# Patient Record
Sex: Male | Born: 1975
Health system: Southern US, Community
[De-identification: ages and names within clinical notes are randomized; demographics above are authoritative.]

## PROBLEM LIST (undated history)

## (undated) DIAGNOSIS — I10 Essential (primary) hypertension: Secondary | ICD-10-CM

## (undated) DIAGNOSIS — M109 Gout, unspecified: Secondary | ICD-10-CM

## (undated) HISTORY — PX: EYE SURGERY: SHX253

## (undated) HISTORY — PX: MENISCUS REPAIR: SHX5179

---

## 2012-01-11 ENCOUNTER — Ambulatory Visit: Payer: Self-pay

## 2013-04-28 ENCOUNTER — Ambulatory Visit: Payer: Self-pay | Admitting: Family Medicine

## 2013-06-18 IMAGING — CR DG CHEST 2V
1 series · 3 of 3 positions shown · non-contrast
Comparison: none

REASON FOR EXAM: cough nonprod. and sinus and chest congestion
COMMENTS:

PROCEDURE:     MDR - MDR CHEST PA(OR AP) AND LATERAL  - April 28, 2013 [DATE]
RESULT:     The lungs are adequately inflated and clear. The cardiac
silhouette is normal in size. The mediastinum is normal in width. There is
no pleural effusion. The bony thorax is normal where visualized.

[Series 1: pa · 0.17mm/px · 3 of 3 slices shown]
[im 1/3]
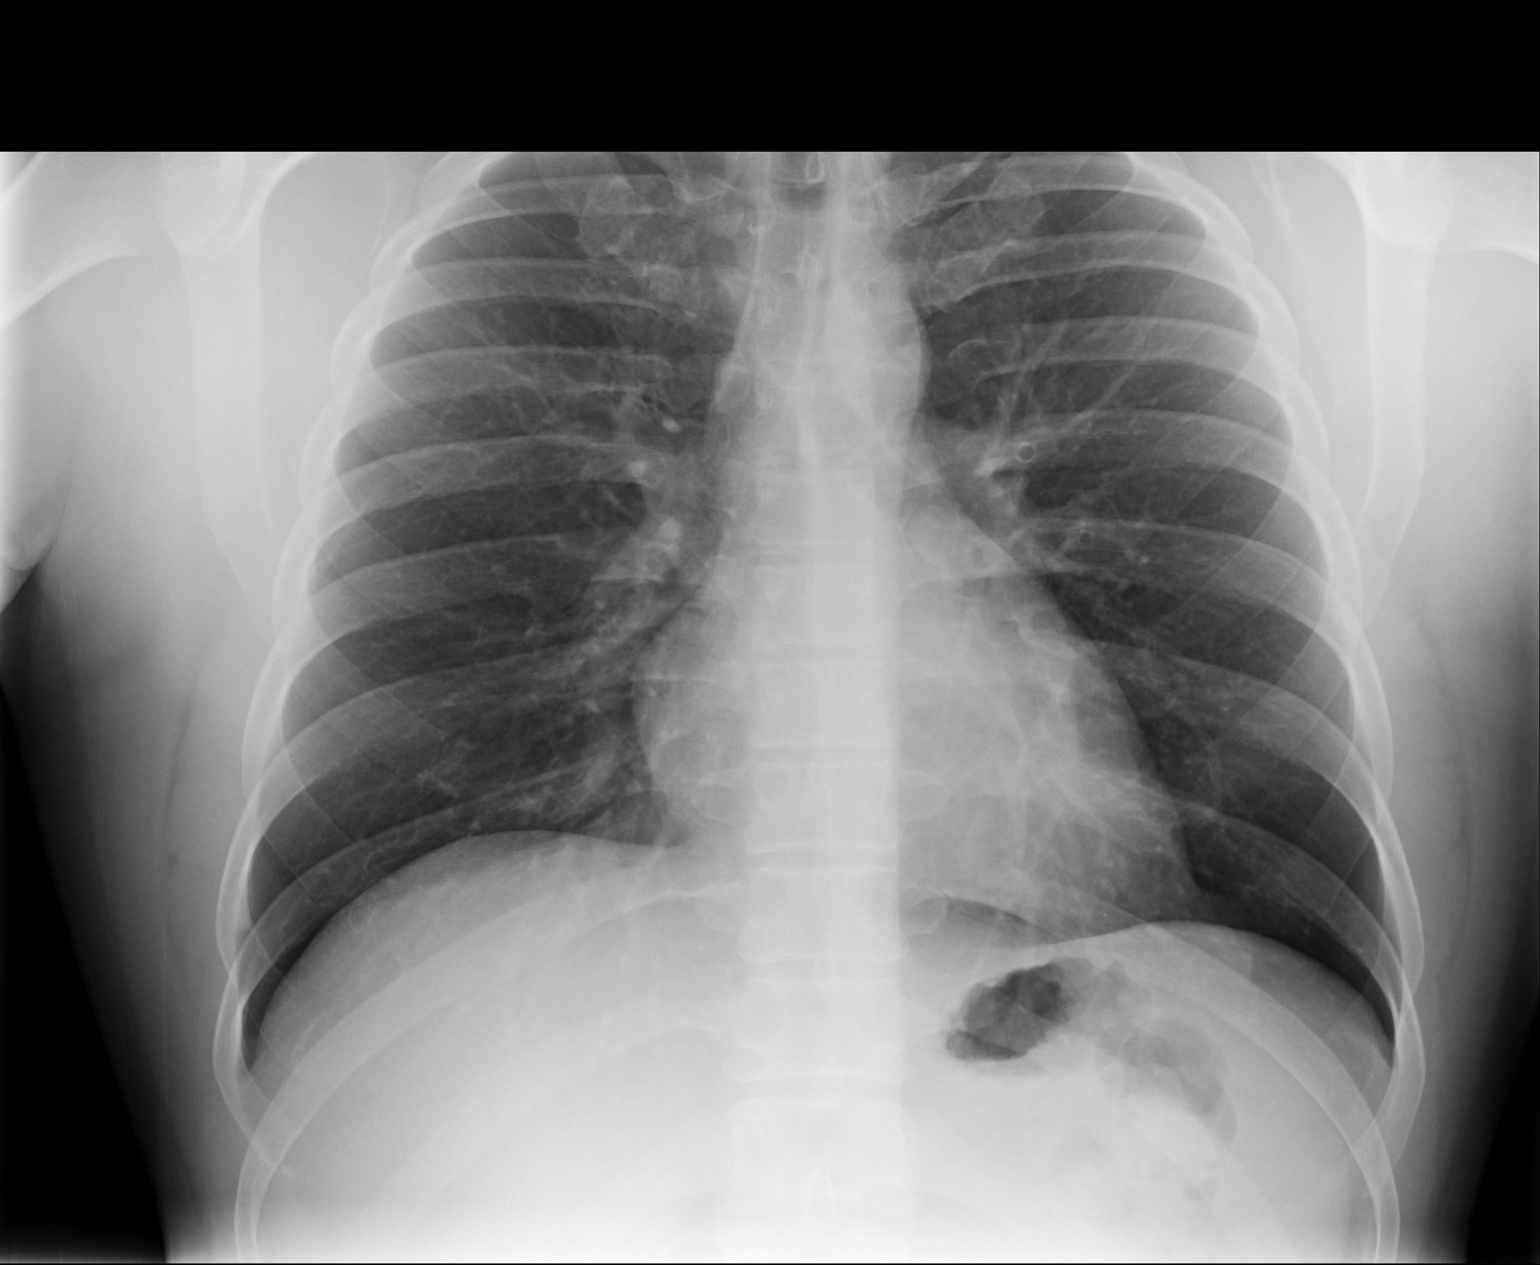
[im 2/3]
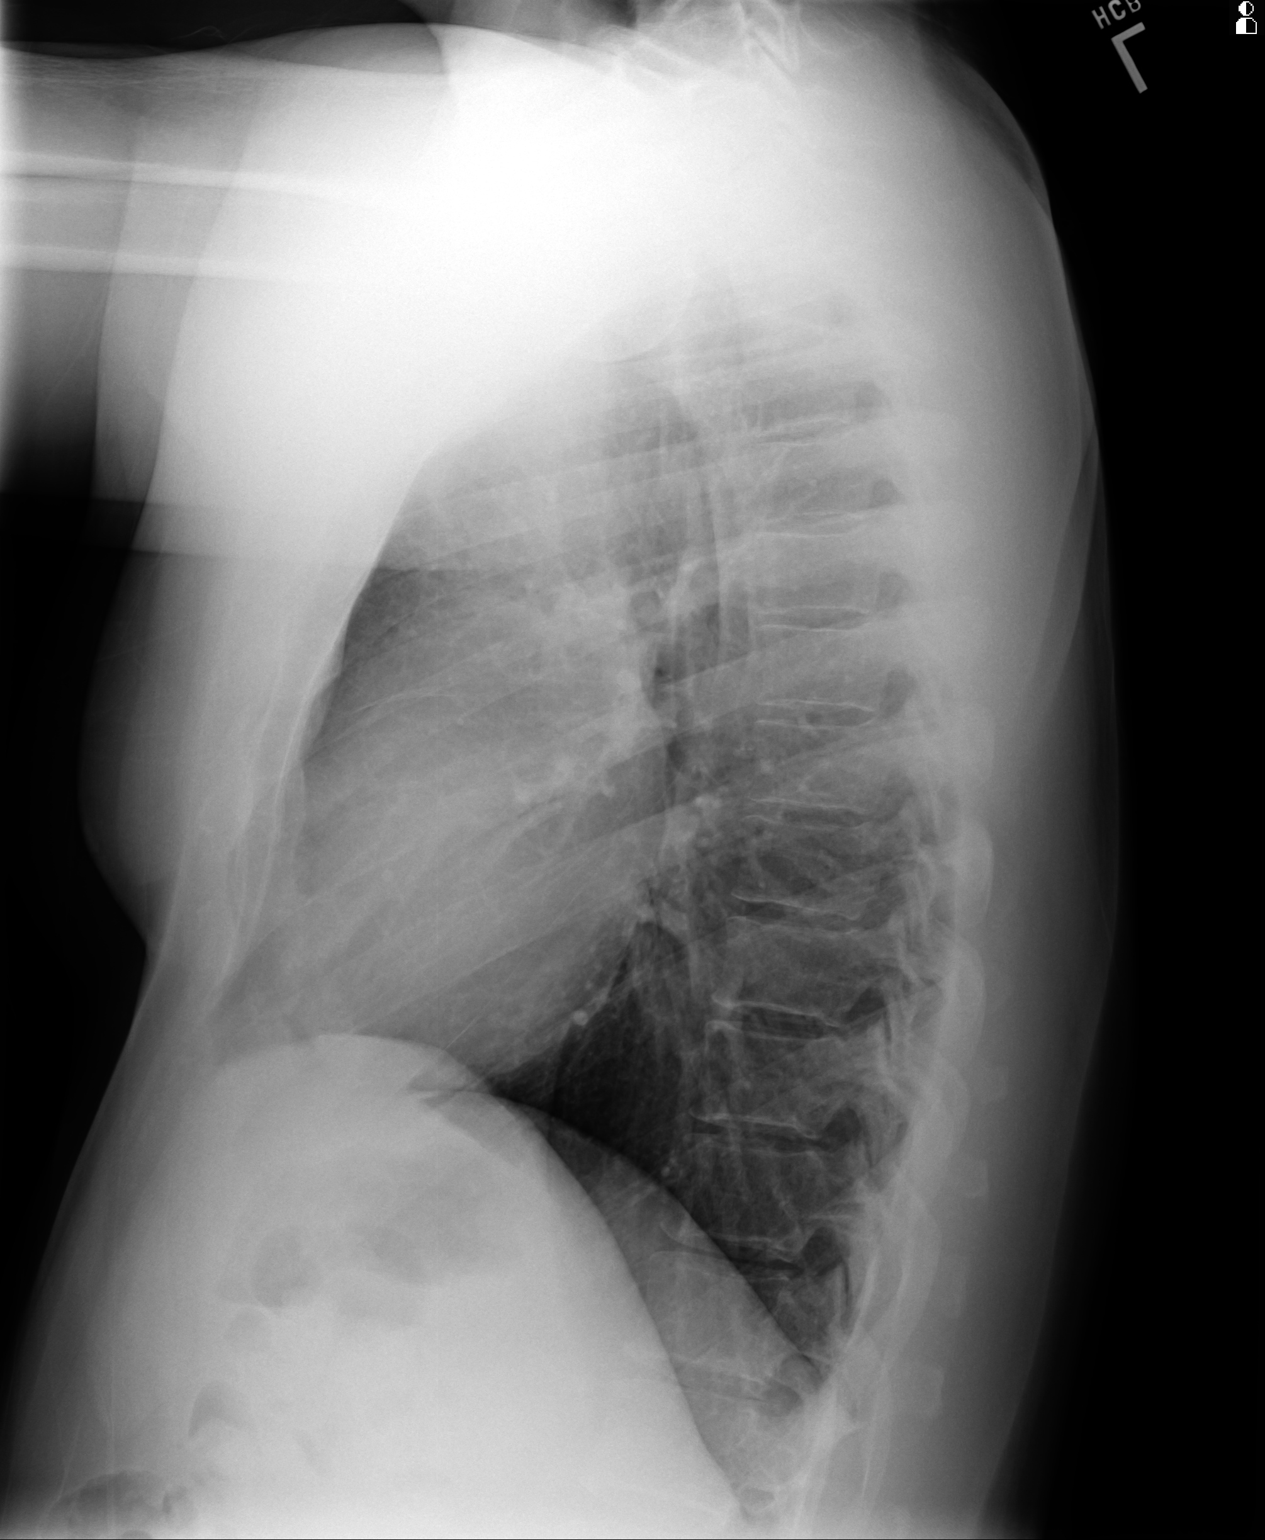
[im 3/3]
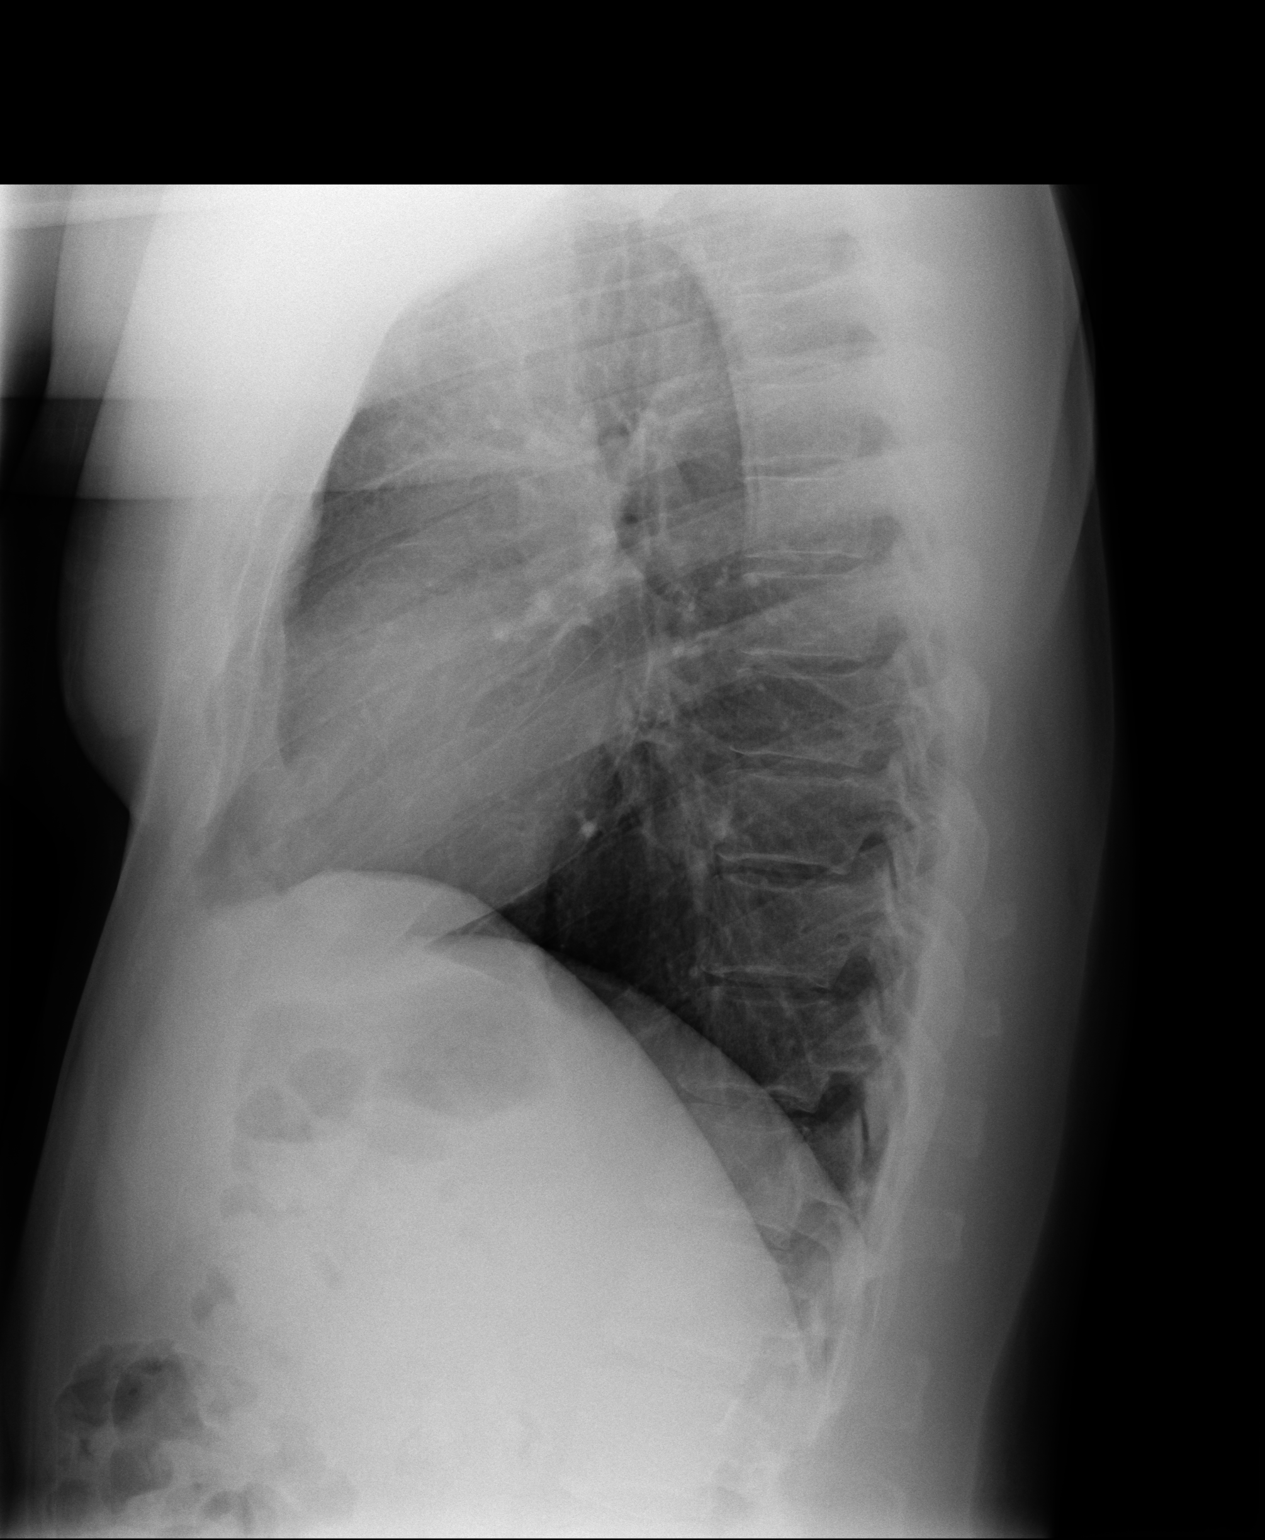

[3 of 3 positions shown; findings below may reference images not displayed]

IMPRESSION: There is no evidence of acute cardiopulmonary abnormality.

[REDACTED]

## 2014-10-28 ENCOUNTER — Ambulatory Visit: Payer: Self-pay | Admitting: Physician Assistant

## 2015-05-26 ENCOUNTER — Other Ambulatory Visit: Payer: Self-pay | Admitting: Family Medicine

## 2015-05-27 ENCOUNTER — Other Ambulatory Visit: Payer: Self-pay | Admitting: Family Medicine

## 2015-08-19 ENCOUNTER — Other Ambulatory Visit: Payer: Self-pay | Admitting: Family Medicine

## 2015-08-19 DIAGNOSIS — E790 Hyperuricemia without signs of inflammatory arthritis and tophaceous disease: Secondary | ICD-10-CM

## 2015-08-19 MED ORDER — ALLOPURINOL 300 MG PO TABS: 300.0000 mg | ORAL_TABLET | Freq: Every day | ORAL | Status: DC

## 2016-03-22 ENCOUNTER — Other Ambulatory Visit: Payer: Self-pay | Admitting: Family Medicine

## 2016-06-17 ENCOUNTER — Other Ambulatory Visit: Payer: Self-pay | Admitting: Family Medicine

## 2016-06-19 NOTE — Telephone Encounter (Signed)
Please review chart and advise. KW 

## 2016-09-13 ENCOUNTER — Other Ambulatory Visit: Payer: Self-pay | Admitting: Family Medicine

## 2016-12-10 ENCOUNTER — Other Ambulatory Visit: Payer: Self-pay | Admitting: Family Medicine

## 2017-03-08 ENCOUNTER — Other Ambulatory Visit: Payer: Self-pay | Admitting: Family Medicine

## 2017-03-09 ENCOUNTER — Encounter: Payer: Self-pay | Admitting: Family Medicine

## 2017-03-09 ENCOUNTER — Ambulatory Visit (INDEPENDENT_AMBULATORY_CARE_PROVIDER_SITE_OTHER): Payer: BLUE CROSS/BLUE SHIELD | Admitting: Family Medicine

## 2017-03-09 VITALS — BP 110/74 | HR 56 | Temp 98.0°F | Resp 16 | Wt 213.0 lb

## 2017-03-09 DIAGNOSIS — E785 Hyperlipidemia, unspecified: Secondary | ICD-10-CM | POA: Insufficient documentation

## 2017-03-09 DIAGNOSIS — Z8739 Personal history of other diseases of the musculoskeletal system and connective tissue: Secondary | ICD-10-CM | POA: Diagnosis not present

## 2017-03-09 DIAGNOSIS — E782 Mixed hyperlipidemia: Secondary | ICD-10-CM

## 2017-03-09 NOTE — Progress Notes (Signed)
Subjective:     Patient ID: Jordan Gonzales, male   DOB: [birth year], 41 y.o.   MRN: [id number]  HPI  Chief Complaint  Patient presents with  . Gout    Patient comes in office today for follow up visit, he states that he recently had Allopurinol refilled and reports good compliance on medication. Patient denies any recent gout flare ups.   Last seen in 2015. Prior attacks have involved his right toe, foot, and ankle. Has family history of gout in his father and uncle.   Review of Systems     Objective:   Physical Exam  Constitutional: He appears well-developed and well-nourished. No distress.       Assessment:    1. History of gout - Renal function panel - Uric acid  2. Mixed hyperlipidemia - Lipid panel    Plan:    Further f/u pending lab results. Continue allopurinol.

## 2017-03-09 NOTE — Patient Instructions (Signed)
We will call you with the lab results. 

## 2017-03-10 LAB — RENAL FUNCTION PANEL
Albumin: 4.9 g/dL (ref 3.5–5.5)
BUN/Creatinine Ratio: 14 (ref 9–20)
BUN: 14 mg/dL (ref 6–24)
CO2: 27 mmol/L (ref 18–29)
Calcium: 9.8 mg/dL (ref 8.7–10.2)
Chloride: 99 mmol/L (ref 96–106)
Creatinine, Ser: 0.98 mg/dL (ref 0.76–1.27)
GFR calc Af Amer: 109 mL/min/{1.73_m2} (ref >59)
GFR calc non Af Amer: 95 mL/min/{1.73_m2} (ref >59)
Glucose: 107 mg/dL — ABNORMAL HIGH (ref 65–99)
Phosphorus: 3.4 mg/dL (ref 2.5–4.5)
Potassium: 4.7 mmol/L (ref 3.5–5.2)
Sodium: 140 mmol/L (ref 134–144)

## 2017-03-10 LAB — LIPID PANEL
Chol/HDL Ratio: 4.6 ratio (ref 0.0–5.0)
Cholesterol, Total: 211 mg/dL — ABNORMAL HIGH (ref 100–199)
HDL: 46 mg/dL (ref >39)
LDL Calculated: 138 mg/dL — ABNORMAL HIGH (ref 0–99)
Triglycerides: 135 mg/dL (ref 0–149)
VLDL Cholesterol Cal: 27 mg/dL (ref 5–40)

## 2017-03-10 LAB — URIC ACID: Uric Acid: 4.6 mg/dL (ref 3.7–8.6)

## 2017-03-12 NOTE — Progress Notes (Signed)
Advised  ED 

## 2017-06-02 ENCOUNTER — Other Ambulatory Visit: Payer: Self-pay | Admitting: Family Medicine

## 2017-09-14 ENCOUNTER — Ambulatory Visit (INDEPENDENT_AMBULATORY_CARE_PROVIDER_SITE_OTHER): Payer: BLUE CROSS/BLUE SHIELD | Admitting: Family Medicine

## 2017-09-14 ENCOUNTER — Encounter: Payer: Self-pay | Admitting: Family Medicine

## 2017-09-14 VITALS — BP 122/78 | HR 72 | Temp 98.5°F | Resp 14 | Wt 209.6 lb

## 2017-09-14 DIAGNOSIS — Z23 Encounter for immunization: Secondary | ICD-10-CM | POA: Diagnosis not present

## 2017-09-14 DIAGNOSIS — Z029 Encounter for administrative examinations, unspecified: Secondary | ICD-10-CM

## 2017-09-14 NOTE — Patient Instructions (Signed)
Continue following a low fat food choice diet.

## 2017-09-14 NOTE — Progress Notes (Signed)
Subjective:     Patient ID: Ina HomesChristopher Murray Borrero, male   DOB: Jun 05, 1976, 41 y.o.   MRN: [id number] HPI  Chief Complaint  Patient presents with  . Follow-up    Patient is here to get forms signed and filled out for him to be able to go through [employer] test for [employer].  He is currently on the [employer]and wishes to get on the [employer] [facility] [facility] [employer] [facility] [employer] [facility]. He is required to do agility tests and needs approval for participation. He states the tests involve activity that he does routinely. 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 Calculated ten year c.v.risk is low @ < 2%.   Review of Systems  Respiratory: Negative for shortness of breath.   Cardiovascular: Negative for chest pain and palpitations.  Endocrine:       Was noted to have 0.5 mm thyroid cysts on a [employer]  Genitourinary:       Was noted to have a fatty liver on a [employer] exam per his job.       Objective:   Physical Exam  Constitutional: He appears well-developed and well-nourished. No distress.  Cardiovascular: Normal rate and regular rhythm.   Pulmonary/Chest: Breath sounds normal.       Assessment:    1. Encounter for administrative examinations: Forms completed for participation in agility tests  2. Need for influenza vaccination - Flu Vaccine QUAD 36+ mos IM    Plan:    Continue low fat food choices.

## 2017-12-03 ENCOUNTER — Encounter: Payer: Self-pay | Admitting: Family Medicine

## 2017-12-03 ENCOUNTER — Ambulatory Visit: Payer: BLUE CROSS/BLUE SHIELD | Admitting: Family Medicine

## 2017-12-03 VITALS — BP 164/110 | HR 67 | Temp 98.6°F | Resp 16 | Wt 212.2 lb

## 2017-12-03 DIAGNOSIS — I10 Essential (primary) hypertension: Secondary | ICD-10-CM

## 2017-12-03 MED ORDER — AMLODIPINE BESYLATE 5 MG PO TABS: 5.0000 mg | ORAL_TABLET | Freq: Every day | ORAL | 0 refills | Status: DC

## 2017-12-03 NOTE — Progress Notes (Signed)
Subjective:     Patient ID: Jordan Gonzales, male   DOB: [birth year], 42 y.o.   MRN: [id number] Chief Complaint  Patient presents with  . Hypertension    Patient comes in office today with concerns of elevated blood pressure since 09/19/17, patient states that he started logging his readings in December and systolic has stayed in the 160s and diastolic has read between 90-110.    HPI Reports he will be starting with the [employer] 2/16 and will need to have his bp under control (at least SBP < 160) to work. States he exercises daily with 2 miles on the treadmill and weights. Has reduced consumption of salt and not on any nsaid's, weight loss medication, or cold medication. There is a paternal history of hypertension.  Review of Systems     Objective:   Physical Exam  Constitutional: He appears well-developed and well-nourished.  Cardiovascular: Normal rate and regular rhythm.  Pulmonary/Chest: Breath sounds normal.  Musculoskeletal: He exhibits no edema (of distal lower extremities).       Assessment:    1. Essential hypertension - Renal function panel - amLODipine (NORVASC) 5 MG tablet; Take 1 tablet (5 mg total) by mouth daily.  Dispense: 30 tablet; Refill: 0    Plan:    Further f/u pending lab work. Nurse bp check in two weeks.

## 2017-12-03 NOTE — Patient Instructions (Signed)
Nurse bp check in two weeks. We will call you with the lab results.

## 2017-12-04 ENCOUNTER — Telehealth: Payer: Self-pay

## 2017-12-04 LAB — RENAL FUNCTION PANEL
Albumin: 4.9 g/dL (ref 3.5–5.5)
BUN/Creatinine Ratio: 11 (ref 9–20)
BUN: 12 mg/dL (ref 6–24)
CO2: 25 mmol/L (ref 20–29)
Calcium: 9.8 mg/dL (ref 8.7–10.2)
Chloride: 101 mmol/L (ref 96–106)
Creatinine, Ser: 1.05 mg/dL (ref 0.76–1.27)
GFR calc Af Amer: 100 mL/min/{1.73_m2} (ref >59)
GFR calc non Af Amer: 87 mL/min/{1.73_m2} (ref >59)
Glucose: 125 mg/dL — ABNORMAL HIGH (ref 65–99)
Phosphorus: 2.8 mg/dL (ref 2.5–4.5)
Potassium: 5.1 mmol/L (ref 3.5–5.2)
Sodium: 141 mmol/L (ref 134–144)

## 2017-12-04 NOTE — Telephone Encounter (Signed)
-----   Message from Anola Gurneyobert Chauvin, [place] sent at 12/04/2017  7:23 AM EST ----- Renal function good.

## 2017-12-04 NOTE — Telephone Encounter (Signed)
Patient advised.KW 

## 2017-12-13 ENCOUNTER — Other Ambulatory Visit: Payer: Self-pay | Admitting: Family Medicine

## 2017-12-13 ENCOUNTER — Telehealth: Payer: Self-pay

## 2017-12-13 MED ORDER — AMLODIPINE BESYLATE 10 MG PO TABS: 10.0000 mg | ORAL_TABLET | Freq: Every day | ORAL | 0 refills | Status: DC

## 2017-12-13 NOTE — Telephone Encounter (Signed)
Amlodipine increased to 10 mg. Daily. 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

## 2017-12-13 NOTE — Telephone Encounter (Signed)
Patient walked into office today for nurse blood pressure checked he states that he feels fine and has no concerns or complaints. Patient reports good compliance on Amlodipine and denies any side effects. Patient blood pressure in house today was 150/54.KW

## 2017-12-29 ENCOUNTER — Other Ambulatory Visit: Payer: Self-pay | Admitting: Family Medicine

## 2017-12-29 DIAGNOSIS — I10 Essential (primary) hypertension: Secondary | ICD-10-CM

## 2018-03-10 ENCOUNTER — Other Ambulatory Visit: Payer: Self-pay | Admitting: Family Medicine

## 2018-05-22 ENCOUNTER — Other Ambulatory Visit: Payer: Self-pay | Admitting: Family Medicine

## 2018-06-20 ENCOUNTER — Encounter: Payer: Self-pay | Admitting: Family Medicine

## 2018-06-20 ENCOUNTER — Ambulatory Visit: Payer: BLUE CROSS/BLUE SHIELD | Admitting: Family Medicine

## 2018-06-20 VITALS — BP 132/84 | HR 66 | Temp 97.8°F | Resp 15 | Wt 207.2 lb

## 2018-06-20 DIAGNOSIS — Z87898 Personal history of other specified conditions: Secondary | ICD-10-CM

## 2018-06-20 NOTE — Patient Instructions (Signed)
Return to work full duty. Continue to keep up with fluids during this hot weather.

## 2018-06-20 NOTE — Progress Notes (Signed)
  Subjective:     Patient ID: Jordan Gonzales, male   DOB: July 11, 1976, 42 y.o.   MRN: [id number] Chief Complaint  Patient presents with  . Letter for School/Work    Patient comes in office today requesting clearance to return back to work. Patient states yesterday while at work he had took a vitamin pack and shortly after taking it within a matter of min he felt nauseas, blood pressure was checked and was high and was advised to seek medical treatment. Patient was tranported to hospital by EMS and was cleared with normal labs, he was instructed to drink plenty of fluids. Patient states that he feels fine today and knows to take vitamin with food.    HPI States he feels well and wishes to return to full duty as a [employer]. He may discontinue the vitamins he was previously taking. No change in bowel or bladder habits or blood in his stool.  Review of Systems     Objective:   Physical Exam  Constitutional: He appears well-developed and well-nourished. No distress.  Cardiovascular: Normal rate and regular rhythm.  Pulmonary/Chest: Breath sounds normal.  Abdominal: Soft. There is no tenderness.  Musculoskeletal: He exhibits no edema (of lower extremities).       Assessment:    1. Hx of nausea: resolved    Plan    May return to full duty.

## 2019-03-03 ENCOUNTER — Telehealth: Payer: Self-pay | Admitting: Physician Assistant

## 2019-03-03 DIAGNOSIS — I10 Essential (primary) hypertension: Secondary | ICD-10-CM

## 2019-03-03 MED ORDER — AMLODIPINE BESYLATE 10 MG PO TABS: 10.0000 mg | ORAL_TABLET | Freq: Every day | ORAL | 0 refills | Status: DC

## 2019-03-03 NOTE — Telephone Encounter (Signed)
Sent in 3 months. Patient needs to establish with new provider in that time frame.

## 2019-03-03 NOTE — Telephone Encounter (Signed)
[facility] faxed refill request for the following medications:  amLODipine (NORVASC) 10 MG tablet  90 day supply  Last Rx: 03/11/2018 LOV: 06/20/2018 with Nadine Counts. Pt isn't scheduled for a follow-up OV. Please advise. Thanks TNP

## 2019-03-03 NOTE — Telephone Encounter (Signed)
Please Review

## 2019-05-16 ENCOUNTER — Other Ambulatory Visit: Payer: Self-pay | Admitting: Family Medicine

## 2019-05-16 MED ORDER — ALLOPURINOL 300 MG PO TABS: 300.0000 mg | ORAL_TABLET | Freq: Every day | ORAL | 3 refills | Status: DC

## 2019-05-16 NOTE — Telephone Encounter (Signed)
[facility] faxed refill request for the following medications:  allopurinol (ZYLOPRIM) 300 MG tablet   Please advise.  

## 2019-05-19 ENCOUNTER — Other Ambulatory Visit: Payer: Self-pay | Admitting: Physician Assistant

## 2019-05-19 DIAGNOSIS — I10 Essential (primary) hypertension: Secondary | ICD-10-CM

## 2019-05-19 NOTE — Telephone Encounter (Signed)
Patient needs to establish with new provider in next 3 months. Should schedule as a CPE for labs.

## 2019-05-19 NOTE — Telephone Encounter (Signed)
Patient advised as directed below. 

## 2019-05-26 ENCOUNTER — Other Ambulatory Visit: Payer: Self-pay

## 2019-05-26 ENCOUNTER — Ambulatory Visit
Admission: EM | Admit: 2019-05-26 | Discharge: 2019-05-26 | Disposition: A | Discharge status: Home / Self Care | Payer: BC Managed Care – PPO | Attending: Urgent Care | Admitting: Urgent Care

## 2019-05-26 DIAGNOSIS — J0120 Acute ethmoidal sinusitis, unspecified: Secondary | ICD-10-CM

## 2019-05-26 MED ORDER — AMOXICILLIN 875 MG PO TABS: 875.0000 mg | ORAL_TABLET | Freq: Two times a day (BID) | ORAL | 0 refills | Status: DC

## 2019-05-26 NOTE — Discharge Instructions (Signed)
It was very nice seeing you today in clinic. Thank you for entrusting me with your care.   Please utilize the medications that we discussed. Your prescriptions have been called in to your pharmacy. Increase fluid intake. Rest. Use Tylenol as needed for pain/fever.   Make arrangements to follow up with your regular doctor in 1 week for re-evaluation if not improving. If your symptoms/condition worsens, please seek follow up care either here or in the ER. Please remember, our [facility] providers are "right here with you" when you need [place].   Again, it was my pleasure to take care of you today. Thank you for choosing our clinic. I hope that you start to feel better quickly.   Honor Loh, MSN, APRN, FNP-C, CEN Advanced Practice Provider [facility]

## 2019-05-26 NOTE — ED Triage Notes (Signed)
Patient states that he has been having sinus pain and pressure that started a few weeks ago. Patient states that he has been taking mucinex without much relief.

## 2019-05-26 NOTE — ED Provider Notes (Signed)
Gonzales, Jordan   Name: Jordan Gonzales DOB: 24-Oct-1976 MRN: [id number] CSN: [id number] PCP: Anola Gurneyhauvin, Robert, PA  Arrival date and time:  05/26/19 0813  Chief Complaint:  Sinusitis   NOTE: Prior to seeing the patient today, I have reviewed the triage nursing documentation and vital signs. Clinical staff has updated patient's PMH/PSHx, current medication list, and drug allergies/intolerances to ensure comprehensive history available to assist in medical decision making.   History:   HPI: Jordan HomesChristopher Murray Gonzales is a 43 y.o. male who presents today with complaints of sinus pain and pressure that has been going on for the last 2 weeks. He denies any associated fevers. Patient notes that he gets a sinus infection at least once a year. Patient has a slight cough that is productive of green sputum. No otalgia or pharyngitis. Patient has been using a Neti pot at home, which is normally effective, however he notes that he "just cannot get it to run". Patient has been using Mucinex for symptomatic relief, which has done little to improve his symptoms.   History reviewed. No pertinent past medical history.  Past Surgical History:  Procedure Laterality Date  . EYE SURGERY    . MENISCUS REPAIR      Family History  Problem Relation Age of Onset  . Prostate cancer Father     Social History   Tobacco Use  . Smoking status: Never Smoker  . Smokeless tobacco: Never Used  Substance Use Topics  . Alcohol use: Yes    Comment: on the weekends sometimes  . Drug use: No    Patient Active Problem List   Diagnosis Date Noted  . HLD (hyperlipidemia) 03/09/2017  . History of gout 03/09/2017    Home Medications:    Current Meds  Medication Sig  . allopurinol (ZYLOPRIM) 300 MG tablet Take 1 tablet (300 mg total) by mouth daily.  Marland Kitchen. amLODipine (NORVASC) 10 MG tablet TAKE 1 TABLET BY MOUTH EVERY DAY    Allergies:   Patient has no known allergies.  Review of Systems (ROS):  Review of Systems  Constitutional: Negative for chills, fatigue and fever.  HENT: Positive for congestion, sinus pressure and sinus pain. Negative for ear pain, postnasal drip and sore throat.   Respiratory: Positive for cough. Negative for shortness of breath.   Cardiovascular: Negative for chest pain and palpitations.  Gastrointestinal: Negative for diarrhea, nausea and vomiting.  Musculoskeletal: Negative for arthralgias, back pain, myalgias and neck pain.  Skin: Negative.   Neurological: Positive for headaches (intermittent; generalized). Negative for dizziness, syncope and weakness.  Hematological: Negative for adenopathy.     Vital Signs: Today's Vitals   05/26/19 0822 05/26/19 0825 05/26/19 0846  BP:  (!) 147/81   Pulse:  85   Resp:  18   Temp:  98.5 F (36.9 C)   TempSrc:  Oral   SpO2:  100%   Weight: 210 lb (95.3 kg)    Height: 5\' 9"  (1.753 m)    PainSc: 1   1     Physical Exam: Physical Exam  Constitutional: He is oriented to person, place, and time and well-developed, well-nourished, and in no distress.  HENT:  Head: Normocephalic and atraumatic.  Right Ear: External ear normal.  Left Ear: External ear normal.  Nose: Mucosal edema and sinus tenderness (ethmodial) present. No rhinorrhea. No epistaxis.  Mouth/Throat: Mucous membranes are normal. Posterior oropharyngeal erythema present. No oropharyngeal exudate or posterior oropharyngeal edema.  Cardiovascular: Normal rate, regular rhythm, normal heart sounds and  intact distal pulses. Exam reveals no gallop and no friction rub.  No murmur heard. Pulmonary/Chest: Effort normal and breath sounds normal. No respiratory distress. He has no wheezes. He has no rales.  Neurological: He is alert and oriented to person, place, and time. Gait normal. GCS score is 15.  Skin: Skin is warm and dry. No rash noted. No erythema.  Psychiatric: Mood, memory, affect and judgment normal.  Nursing note and vitals reviewed.  Urgent  Care Treatments / Results:   LABS: PLEASE NOTE: all labs that were ordered this encounter are listed, however only abnormal results are displayed. Labs Reviewed - No data to display  EKG: -None  RADIOLOGY: No results found.  PROCEDURES: Procedures  MEDICATIONS RECEIVED THIS VISIT: Medications - No data to display  PERTINENT CLINICAL COURSE NOTES/UPDATES:   Initial Impression / Assessment and Plan / Urgent Care Course:  Pertinent labs & imaging results that were available during my care of the patient were personally reviewed by me and considered in my medical decision making (see lab/imaging section of note for values and interpretations).  Jordan CourtsChristopher Murray Basilia JumboCovington is a 43 y.o. male who [facility] today with complaints of Sinusitis   Patient overall well appearing and in no acute distress today in clinic. Exam reveals tenderness and pressure over ethmoid sinus area. He has intermittent headaches associated with his symptoms; none at present. No fevers. (+) intermittent cough that is productive of green sputum. Has been treating symptomatically at home with Mucinex and a Neti pot x 2 weeks, however he is not improving. Patient eating and drinking well.  Symptoms consistent with acute ethmoidal sinusitis.  Will treat with a seven-day course of amoxicillin.  Patient encouraged to remain hydrated, continue Mucinex, and use APAP and/or IBU on a PRN basis.  Patient encouraged to rest until feeling better.  Discussed follow up with primary care physician in 1 week for re-evaluation. I have reviewed the follow up and strict return precautions for any new or worsening symptoms. Patient is aware of symptoms that would be deemed urgent/emergent, and would thus require further evaluation either here or in the emergency department. At the time of discharge, he verbalized understanding and consent with the discharge plan as it was reviewed with him. All questions were fielded by  provider and/or clinic staff prior to patient discharge.    Final Clinical Impressions / Urgent Care Diagnoses:   Final diagnoses:  Acute non-recurrent ethmoidal sinusitis    New Prescriptions:  [place] Controlled Substance Registry consulted? Not Applicable  Meds ordered this encounter  Medications  . amoxicillin (AMOXIL) 875 MG tablet    Sig: Take 1 tablet (875 mg total) by mouth 2 (two) times daily.    Dispense:  14 tablet    Refill:  0    Recommended Follow up Care:  Patient encouraged to follow up with the following provider within the specified time frame, or sooner as dictated by the severity of his symptoms. As always, he was instructed that for any urgent/emergent care needs, he should seek care either here or in the emergency department for more immediate evaluation. Follow-up Information    Anola GurneyChauvin, Robert, [place] In 1 week.   Specialty: Family Medicine Why: General reassessment if symptoms not improving/resolving. Contact information: [address] [place] [address] [phone]          NOTE: This note was prepared using Dragon dictation software along with smaller phrase technology. Despite my best ability to proofread, there  is the potential that transcriptional errors may still occur from this process, and are completely unintentional.    Karen Kitchens, NP 05/26/19 [phone]

## 2019-08-08 ENCOUNTER — Other Ambulatory Visit: Payer: Self-pay

## 2019-08-08 ENCOUNTER — Ambulatory Visit: Payer: BC Managed Care – PPO | Admitting: Physician Assistant

## 2019-08-08 ENCOUNTER — Other Ambulatory Visit: Payer: Self-pay | Admitting: Physician Assistant

## 2019-08-08 ENCOUNTER — Encounter: Payer: Self-pay | Admitting: Physician Assistant

## 2019-08-08 VITALS — BP 139/84 | HR 116 | Temp 97.3°F | Resp 16 | Ht 69.0 in | Wt 212.8 lb

## 2019-08-08 DIAGNOSIS — Z8739 Personal history of other diseases of the musculoskeletal system and connective tissue: Secondary | ICD-10-CM | POA: Diagnosis not present

## 2019-08-08 DIAGNOSIS — R739 Hyperglycemia, unspecified: Secondary | ICD-10-CM

## 2019-08-08 DIAGNOSIS — Z0000 Encounter for general adult medical examination without abnormal findings: Secondary | ICD-10-CM | POA: Diagnosis not present

## 2019-08-08 DIAGNOSIS — Z114 Encounter for screening for human immunodeficiency virus [HIV]: Secondary | ICD-10-CM

## 2019-08-08 DIAGNOSIS — I10 Essential (primary) hypertension: Secondary | ICD-10-CM

## 2019-08-08 DIAGNOSIS — Z23 Encounter for immunization: Secondary | ICD-10-CM

## 2019-08-08 DIAGNOSIS — E782 Mixed hyperlipidemia: Secondary | ICD-10-CM | POA: Diagnosis not present

## 2019-08-08 MED ORDER — AMLODIPINE BESYLATE 10 MG PO TABS: 10.0000 mg | ORAL_TABLET | Freq: Every day | ORAL | 3 refills | Status: DC

## 2019-08-08 MED ORDER — ALLOPURINOL 300 MG PO TABS: 300.0000 mg | ORAL_TABLET | Freq: Every day | ORAL | 3 refills | Status: DC

## 2019-08-08 NOTE — Progress Notes (Signed)
       Patient: Jordan Gonzales Male    DOB: 10-Feb-1976   43 y.o.   MRN: [id number] Visit Date: 08/08/2019  Today's Provider: Trinna Post, PA-C   No chief complaint on file.  Subjective:     HPI      No Known Allergies   Current Outpatient Medications:  .  allopurinol (ZYLOPRIM) 300 MG tablet, Take 1 tablet (300 mg total) by mouth daily., Disp: 90 tablet, Rfl: 3 .  amLODipine (NORVASC) 10 MG tablet, TAKE 1 TABLET BY MOUTH EVERY DAY, Disp: 90 tablet, Rfl: 0 .  amoxicillin (AMOXIL) 875 MG tablet, Take 1 tablet (875 mg total) by mouth 2 (two) times daily., Disp: 14 tablet, Rfl: 0  Review of Systems  Social History   Tobacco Use  . Smoking status: Never Smoker  . Smokeless tobacco: Never Used  Substance Use Topics  . Alcohol use: Yes    Comment: on the weekends sometimes      Objective:   There were no vitals taken for this visit. There were no vitals filed for this visit.There is no height or weight on file to calculate BMI.   Physical Exam   No results found for any visits on 08/08/19.     Assessment & [place], PA-C  [facility]

## 2019-08-08 NOTE — Progress Notes (Signed)
Patient: Jordan Gonzales Male    DOB: 03/01/1976   43 y.o.   MRN: [id number] Visit Date: 08/08/2019  Today's Provider: Trinna Post, PA-C   Chief Complaint  Patient presents with  . Hyperlipidemia   Subjective:   Presents today for CPE and f/u. Previously saw Mariel Sleet, PA-C who has since retired. Lives in [place] with wife of 58 years and 42 year old son. 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 Does not smoke, alcohol on weekends, drugs never. No family history of colon cancer. Father with history of prostate cancer diagnosed in his 55's.   HPI   Lipid/Cholesterol, Follow-up:   Last seen for this5 months ago.  Management changes since that visit include no changes. . Last Lipid Panel:    Component Value Date/Time   CHOL 211 (H) 03/09/2017 0956   TRIG 135 03/09/2017 0956   HDL 46 03/09/2017 0956   CHOLHDL 4.6 03/09/2017 0956   LDLCALC 138 (H) 03/09/2017 0956    Risk factors for vascular disease include hypercholesterolemia and hypertension  He reports excellent compliance with treatment. He is not having side effects.  Current symptoms include none and have been stable. Weight trend: stable Prior visit with dietician: no Current diet: in general, an "unhealthy" diet Current exercise: walking  Wt Readings from Last 3 Encounters:  08/08/19 212 lb 12.8 oz (96.5 kg)  05/26/19 210 lb (95.3 kg)  06/20/18 207 lb 3.2 oz (94 kg)   Gout: Currently taking allopurinol 300 mg tablet daily. Prior to this he was getting gout every 3 months on his feet. Since taking this medication he has not had a gout flare.  CMP Latest Ref Rng & Units 12/03/2017 03/09/2017  Glucose 65 - 99 mg/dL 125(H) 107(H)  BUN 6 - 24 mg/dL 12 14  Creatinine 0.76 - 1.27 mg/dL 1.05 0.98  Sodium 134 - 144 mmol/L 141 140  Potassium 3.5 - 5.2 mmol/L 5.1 4.7  Chloride 96 - 106 mmol/L 101 99  CO2 20 - 29 mmol/L 25 27  Calcium 8.7 - 10.2  mg/dL 9.8 9.8   Lab Results  Component Value Date   LABURIC 4.6 03/09/2017    -------------------------------------------------------------------  No Known Allergies   Current Outpatient Medications:  .  allopurinol (ZYLOPRIM) 300 MG tablet, Take 1 tablet (300 mg total) by mouth daily., Disp: 90 tablet, Rfl: 3 .  amLODipine (NORVASC) 10 MG tablet, TAKE 1 TABLET BY MOUTH EVERY DAY, Disp: 90 tablet, Rfl: 0  Review of Systems  Constitutional: Negative.   Respiratory: Negative.   Cardiovascular: Negative.     Social History   Tobacco Use  . Smoking status: Never Smoker  . Smokeless tobacco: Never Used  Substance Use Topics  . Alcohol use: Yes    Comment: on the weekends sometimes      Objective:   BP 139/84 (BP Location: Right Arm, Patient Position: Sitting, Cuff Size: Large)   Pulse (!) 116   Temp (!) 97.3 F (36.3 C) (Temporal)   Resp 16   Ht 5\' 9"  (1.753 m)   Wt 212 lb 12.8 oz (96.5 kg)   BMI 31.43 kg/m  Vitals:   08/08/19 0840  BP: 139/84  Pulse: (!) 116  Resp: 16  Temp: (!) 97.3 F (36.3 C)  TempSrc: Temporal  Weight: 212 lb 12.8 oz (96.5 kg)  Height: 5\' 9"  (1.753 m)  Body mass index is 31.43 kg/m.  Physical Exam Constitutional:      Appearance: Normal appearance.  Cardiovascular:     Rate and Rhythm: Normal rate and regular rhythm.     Heart sounds: Normal heart sounds.  Pulmonary:     Effort: Pulmonary effort is normal.     Breath sounds: Normal breath sounds.  Abdominal:     General: Bowel sounds are normal.     Palpations: Abdomen is soft.  Skin:    General: Skin is warm and dry.  Neurological:     Mental Status: He is alert and oriented to person, place, and time. Mental status is at baseline.  Psychiatric:        Mood and Affect: Mood normal.        Behavior: Behavior normal.      No results found for any visits on 08/08/19.     Assessment & Plan    1. Annual physical exam   2. Mixed hyperlipidemia  - CBC with  Differential - Lipid Profile  3. Need for influenza vaccination  - Flu Vaccine QUAD 36+ mos IM  4. Essential hypertension  Slightly elevated today, patient anxious about labwork. Continue amlodipine 10 mg daily.   - Comprehensive Metabolic Panel (CMET) - TSH - amLODipine (NORVASC) 10 MG tablet; Take 1 tablet (10 mg total) by mouth daily.  Dispense: 90 tablet; Refill: 3  5. History of gout  Check renal function and continue allopurinol.  - Uric acid - allopurinol (ZYLOPRIM) 300 MG tablet; Take 1 tablet (300 mg total) by mouth daily.  Dispense: 90 tablet; Refill: 3  6. Hyperglycemia  - HgB A1c  7. Encounter for screening for HIV  - HIV antibody (with reflex)  The entirety of the information documented in the History of Present Illness, Review of Systems and Physical Exam were personally obtained by me. Portions of this information were initially documented by Rondel BatonSulibeya Dimas, CMA and reviewed by me for thoroughness and accuracy.   F/u 1 year CPE and HTN    Trey SailorsAdriana M Deylan Canterbury, PA-C  [facility]

## 2019-08-08 NOTE — Patient Instructions (Signed)

## 2019-08-09 LAB — COMPREHENSIVE METABOLIC PANEL
ALT: 40 IU/L (ref 0–44)
AST: 28 IU/L (ref 0–40)
Albumin/Globulin Ratio: 2.1 (ref 1.2–2.2)
Albumin: 5.1 g/dL — ABNORMAL HIGH (ref 4.0–5.0)
Alkaline Phosphatase: 55 IU/L (ref 39–117)
BUN/Creatinine Ratio: 12 (ref 9–20)
BUN: 14 mg/dL (ref 6–24)
Bilirubin Total: 1.1 mg/dL (ref 0.0–1.2)
CO2: 22 mmol/L (ref 20–29)
Calcium: 10 mg/dL (ref 8.7–10.2)
Chloride: 102 mmol/L (ref 96–106)
Creatinine, Ser: 1.15 mg/dL (ref 0.76–1.27)
GFR calc Af Amer: 89 mL/min/{1.73_m2} (ref >59)
GFR calc non Af Amer: 77 mL/min/{1.73_m2} (ref >59)
Globulin, Total: 2.4 g/dL (ref 1.5–4.5)
Glucose: 124 mg/dL — ABNORMAL HIGH (ref 65–99)
Potassium: 4.3 mmol/L (ref 3.5–5.2)
Sodium: 140 mmol/L (ref 134–144)
Total Protein: 7.5 g/dL (ref 6.0–8.5)

## 2019-08-09 LAB — LIPID PANEL
Chol/HDL Ratio: 5.8 ratio — ABNORMAL HIGH (ref 0.0–5.0)
Cholesterol, Total: 249 mg/dL — ABNORMAL HIGH (ref 100–199)
HDL: 43 mg/dL (ref >39)
LDL Chol Calc (NIH): 169 mg/dL — ABNORMAL HIGH (ref 0–99)
Triglycerides: 199 mg/dL — ABNORMAL HIGH (ref 0–149)
VLDL Cholesterol Cal: 37 mg/dL (ref 5–40)

## 2019-08-09 LAB — CBC WITH DIFFERENTIAL/PLATELET
Basophils Absolute: 0 10*3/uL (ref 0.0–0.2)
Basos: 1 %
EOS (ABSOLUTE): 0.2 10*3/uL (ref 0.0–0.4)
Eos: 4 %
Hematocrit: 45 % (ref 37.5–51.0)
Hemoglobin: 15.7 g/dL (ref 13.0–17.7)
Immature Grans (Abs): 0 10*3/uL (ref 0.0–0.1)
Immature Granulocytes: 0 %
Lymphocytes Absolute: 1.7 10*3/uL (ref 0.7–3.1)
Lymphs: 33 %
MCH: 33.1 pg — ABNORMAL HIGH (ref 26.6–33.0)
MCHC: 34.9 g/dL (ref 31.5–35.7)
MCV: 95 fL (ref 79–97)
Monocytes Absolute: 0.5 10*3/uL (ref 0.1–0.9)
Monocytes: 11 %
Neutrophils Absolute: 2.6 10*3/uL (ref 1.4–7.0)
Neutrophils: 51 %
Platelets: 270 10*3/uL (ref 150–450)
RBC: 4.74 x10E6/uL (ref 4.14–5.80)
RDW: 13.5 % (ref 11.6–15.4)
WBC: 5 10*3/uL (ref 3.4–10.8)

## 2019-08-09 LAB — HIV ANTIBODY (ROUTINE TESTING W REFLEX): HIV Screen 4th Generation wRfx: NONREACTIVE

## 2019-08-09 LAB — HEMOGLOBIN A1C
Est. average glucose Bld gHb Est-mCnc: 117 mg/dL
Hgb A1c MFr Bld: 5.7 % — ABNORMAL HIGH (ref 4.8–5.6)

## 2019-08-09 LAB — URIC ACID: Uric Acid: 5.4 mg/dL (ref 3.7–8.6)

## 2019-08-09 LAB — TSH: TSH: 1.06 u[IU]/mL (ref 0.450–4.500)

## 2019-08-11 ENCOUNTER — Telehealth: Payer: Self-pay

## 2019-08-11 NOTE — Telephone Encounter (Signed)
-----   Message from Trinna Post, [place] sent at 08/11/2019  3:22 PM EDT ----- CBC normal. CMET normal. Cholesterol high but overall risk of heart disease low and he doesn't need any medication for this. Sugars show prediabetes, which can turn into diabetes over time. Should be monitored on a yearly basis.

## 2019-08-11 NOTE — Telephone Encounter (Signed)
Pt advised.   Thanks,   -Nhyira Leano  

## 2019-09-05 ENCOUNTER — Other Ambulatory Visit: Payer: Self-pay

## 2019-09-05 DIAGNOSIS — Z20822 Contact with and (suspected) exposure to covid-19: Secondary | ICD-10-CM

## 2019-09-07 ENCOUNTER — Other Ambulatory Visit: Payer: Self-pay

## 2019-09-07 ENCOUNTER — Ambulatory Visit
Admission: EM | Admit: 2019-09-07 | Discharge: 2019-09-07 | Disposition: A | Discharge status: Home / Self Care | Payer: BC Managed Care – PPO | Attending: Family Medicine | Admitting: Family Medicine

## 2019-09-07 ENCOUNTER — Encounter: Payer: Self-pay | Admitting: Emergency Medicine

## 2019-09-07 DIAGNOSIS — J40 Bronchitis, not specified as acute or chronic: Secondary | ICD-10-CM | POA: Diagnosis not present

## 2019-09-07 DIAGNOSIS — R0981 Nasal congestion: Secondary | ICD-10-CM

## 2019-09-07 DIAGNOSIS — J9801 Acute bronchospasm: Secondary | ICD-10-CM

## 2019-09-07 LAB — NOVEL CORONAVIRUS, NAA: SARS-CoV-2, NAA: NOT DETECTED

## 2019-09-07 MED ORDER — ALBUTEROL SULFATE HFA 108 (90 BASE) MCG/ACT IN AERS: 1.0000 | INHALATION_SPRAY | Freq: Four times a day (QID) | RESPIRATORY_TRACT | 0 refills | Status: DC | PRN

## 2019-09-07 MED ORDER — HYDROCOD POLST-CPM POLST ER 10-8 MG/5ML PO SUER: 5.0000 mL | Freq: Every evening | ORAL | 0 refills | Status: DC | PRN

## 2019-09-07 MED ORDER — PREDNISONE 10 MG PO TABS: ORAL_TABLET | ORAL | 0 refills | Status: DC

## 2019-09-07 MED ORDER — AMOXICILLIN 875 MG PO TABS: 875.0000 mg | ORAL_TABLET | Freq: Two times a day (BID) | ORAL | 0 refills | Status: DC

## 2019-09-07 NOTE — ED Triage Notes (Signed)
Pt c/o sinus congestion, sinus pain/pressure, cough, and post nasal drainage. Started about 4 days ago. He was tested for COVID 2 days and got the results this morning and was negative.

## 2019-09-07 NOTE — ED Provider Notes (Signed)
[facility]    CSN: [id number] Arrival date & time: 09/07/19  0865      History   Chief Complaint Chief Complaint  Patient presents with  . Cough  . Nasal Congestion    HPI Jordan Gonzales is a 43 y.o. male.   43 yo male with a c/o sinus pressure, congestion, productive cough, wheezing for the past 4 days. Had a covid test on Friday (2 days ago) that resulted negative. Denies any chest pains or shortness of breath. Has been taking mucinex without relief.    Cough   History reviewed. No pertinent past medical history.  Patient Active Problem List   Diagnosis Date Noted  . HLD (hyperlipidemia) 03/09/2017  . History of gout 03/09/2017    Past Surgical History:  Procedure Laterality Date  . EYE SURGERY    . MENISCUS REPAIR         Home Medications    Prior to Admission medications   Medication Sig Start Date End Date Taking? Authorizing Provider  allopurinol (ZYLOPRIM) 300 MG tablet Take 1 tablet (300 mg total) by mouth daily. 08/08/19  Yes Carles Collet M, PA-C  amLODipine (NORVASC) 10 MG tablet Take 1 tablet (10 mg total) by mouth daily. 08/08/19  Yes Trinna Post, PA-C  albuterol (VENTOLIN HFA) 108 (90 Base) MCG/ACT inhaler Inhale 1-2 puffs into the lungs every 6 (six) hours as needed for wheezing or shortness of breath. 09/07/19   Norval Gable, MD  amoxicillin (AMOXIL) 875 MG tablet Take 1 tablet (875 mg total) by mouth 2 (two) times daily. 09/07/19   Norval Gable, MD  chlorpheniramine-HYDROcodone (TUSSIONEX PENNKINETIC ER) 10-8 MG/5ML SUER Take 5 mLs by mouth at bedtime as needed. 09/07/19   Norval Gable, MD  predniSONE (DELTASONE) 10 MG tablet Start 60 mg po day one, then 50 mg po day two, taper by 10 mg daily until complete. 09/07/19   Norval Gable, MD    Family History Family History  Problem Relation Age of Onset  . Prostate cancer Father     Social History Social History   Tobacco Use  . Smoking status:  Never Smoker  . Smokeless tobacco: Never Used  Substance Use Topics  . Alcohol use: Yes    Comment: on the weekends sometimes  . Drug use: No     Allergies   Patient has no known allergies.   Review of Systems Review of Systems  Respiratory: Positive for cough.      Physical Exam Triage Vital Signs ED Triage Vitals  Enc Vitals Group     BP 09/07/19 0950 (!) 157/85     Pulse Rate 09/07/19 0950 97     Resp 09/07/19 0950 18     Temp 09/07/19 0950 98.1 F (36.7 C)     Temp Source 09/07/19 0950 Oral     SpO2 09/07/19 0950 99 %     Weight 09/07/19 0948 210 lb (95.3 kg)     Height 09/07/19 0948 5\' 9"  (1.753 m)     Head Circumference --      Peak Flow --      Pain Score 09/07/19 0948 0     Pain Loc --      Pain Edu? --      Excl. in [place]? --    No data found.  Updated Vital Signs BP (!) 157/85 (BP Location: Right Arm)   Pulse 97   Temp 98.1 F (36.7 C) (Oral)   Resp 18  Ht 5\' 9"  (1.753 m)   Wt 95.3 kg   SpO2 99%   BMI 31.01 kg/m   Visual Acuity Right Eye Distance:   Left Eye Distance:   Bilateral Distance:    Right Eye Near:   Left Eye Near:    Bilateral Near:     Physical Exam Vitals signs and nursing note reviewed.  Constitutional:      General: He is not in acute distress.    Appearance: He is not toxic-appearing or diaphoretic.  HENT:     Nose: Congestion present.  Neck:     Musculoskeletal: Neck supple.  Cardiovascular:     Rate and Rhythm: Normal rate and regular rhythm.  Pulmonary:     Effort: Pulmonary effort is normal. No respiratory distress.     Breath sounds: No stridor. Wheezing (left base) and rhonchi (left base) present. No rales.  Neurological:     Mental Status: He is alert.      UC Treatments / Results  Labs (all labs ordered are listed, but only abnormal results are displayed) Labs Reviewed - No data to display  EKG   Radiology No results found.  Procedures Procedures (including critical care time)  Medications  Ordered in UC Medications - No data to display  Initial Impression / Assessment and Plan / UC Course  I have reviewed the triage vital signs and the nursing notes.  Pertinent labs & imaging results that were available during my care of the patient were reviewed by me and considered in my medical decision making (see chart for details).      Final Clinical Impressions(s) / UC Diagnoses   Final diagnoses:  Bronchitis  Nasal congestion  Bronchospasm    ED Prescriptions    Medication Sig Dispense Auth. Provider   amoxicillin (AMOXIL) 875 MG tablet Take 1 tablet (875 mg total) by mouth 2 (two) times daily. 20 tablet , MD   predniSONE (DELTASONE) 10 MG tablet Start 60 mg po day one, then 50 mg po day two, taper by 10 mg daily until complete. 21 tablet Payton Mccallum, MD   albuterol (VENTOLIN HFA) 108 (90 Base) MCG/ACT inhaler Inhale 1-2 puffs into the lungs every 6 (six) hours as needed for wheezing or shortness of breath. 8 g Payton Mccallum, MD   chlorpheniramine-HYDROcodone [facility] ER) 10-8 MG/5ML SUER Take 5 mLs by mouth at bedtime as needed. 70 mL [facility], MD      1. diagnosis reviewed with patient 2. rx as per orders above; reviewed possible side effects, interactions, risks and benefits  3. Recommend supportive treatment with rest, fluids 4. Follow-up prn if symptoms worsen or don't improve    I have reviewed the PDMP during this encounter.   Payton Mccallum, MD 09/07/19 1113

## 2020-08-11 ENCOUNTER — Other Ambulatory Visit: Payer: Self-pay | Admitting: Physician Assistant

## 2020-08-11 DIAGNOSIS — Z8739 Personal history of other diseases of the musculoskeletal system and connective tissue: Secondary | ICD-10-CM

## 2020-08-11 NOTE — Telephone Encounter (Signed)
Requested Prescriptions  Pending Prescriptions Disp Refills  . allopurinol (ZYLOPRIM) 300 MG tablet [Pharmacy Med Name: ALLOPURINOL 300 MG TABLET] 30 tablet 0    Sig: TAKE 1 TABLET BY MOUTH EVERY DAY     Endocrinology:  Gout Agents Failed - 08/11/2020  1:17 AM      Failed - Uric Acid in normal range and within 360 days    Uric Acid  Date Value Ref Range Status  08/08/2019 5.4 3.7 - 8.6 mg/dL Final    Comment:               Therapeutic target for gout patients: <6.0         Failed - Cr in normal range and within 360 days    Creatinine, Ser  Date Value Ref Range Status  08/08/2019 1.15 0.76 - 1.27 mg/dL Final         Failed - Valid encounter within last 12 months    Recent Outpatient Visits          1 year ago Annual physical exam   [facility] Trey Sailors, PA-C   2 years ago Hx of nausea   [facility] [place], [place]   2 years ago Essential hypertension   [facility] [place], [place], [place]   2 years ago Audiological scientist for administrative examinations   [facility] [employer], [place], [place]   3 years ago Mixed hyperlipidemia   [facility] [place], [place], [place]             One month courtesy refill with a reminder for patient to call and schedule physical.

## 2020-08-27 ENCOUNTER — Other Ambulatory Visit: Payer: Self-pay | Admitting: Physician Assistant

## 2020-08-27 DIAGNOSIS — I10 Essential (primary) hypertension: Secondary | ICD-10-CM

## 2020-09-06 ENCOUNTER — Other Ambulatory Visit: Payer: Self-pay | Admitting: Physician Assistant

## 2020-09-06 DIAGNOSIS — Z8739 Personal history of other diseases of the musculoskeletal system and connective tissue: Secondary | ICD-10-CM

## 2020-09-27 ENCOUNTER — Other Ambulatory Visit: Payer: Self-pay | Admitting: Physician Assistant

## 2020-09-27 DIAGNOSIS — I10 Essential (primary) hypertension: Secondary | ICD-10-CM

## 2020-09-27 MED ORDER — AMLODIPINE BESYLATE 10 MG PO TABS: 10.0000 mg | ORAL_TABLET | Freq: Every day | ORAL | 0 refills | Status: DC

## 2020-09-27 NOTE — Telephone Encounter (Signed)
Requested Prescriptions  Pending Prescriptions Disp Refills  . amLODipine (NORVASC) 10 MG tablet 11 tablet 0    Sig: Take 1 tablet (10 mg total) by mouth daily.     Cardiovascular:  Calcium Channel Blockers Failed - 09/27/2020  2:02 PM      Failed - Last BP in normal range    BP Readings from Last 1 Encounters:  09/07/19 (!) 157/85         Failed - Valid encounter within last 6 months    Recent Outpatient Visits          1 year ago Annual physical exam   [facility] Osvaldo Angst M, [place]   2 years ago Hx of nausea   [facility] [place], [place], [place]   2 years ago Essential hypertension   [facility] [place], [place], [place]   3 years ago Encounter for administrative examinations   [facility] [place], [place], [place]   3 years ago Mixed hyperlipidemia   [facility] [place], [place], [place]      Future Appointments            In 1 week Trey Sailors, PA-C [facility], [facility]           Given courtesy refill, # 11/ no refills, to bridge pt. Until appt. On 10/07/20.

## 2020-09-27 NOTE — Telephone Encounter (Signed)
Medication: Apt 10/07/20 amLODipine (NORVASC) 10 MG tablet [[id number]]  Has the patient contacted their pharmacy? YES  (Agent: If no, request that the patient contact the pharmacy for the refill.) (Agent: If yes, when and what did the pharmacy advise?)  Preferred Pharmacy (with phone number [facility] street [facility] [phone] Dan Humphreys, [place] - [address] [place] [address] Phone: [phone] Fax: [phone] Hours: Not open 24 hours    Agent: Please be advised that RX refills may take up to 3 business days. We ask that you follow-up with your pharmacy.

## 2020-10-06 NOTE — Progress Notes (Signed)
Complete physical exam   Patient: Jordan Gonzales   DOB: Feb 04, 1976   44 y.o. Male  MRN: [id number] Visit Date: 10/07/2020  Today's healthcare provider: Trey Sailors, PA-C   Chief Complaint  Patient presents with  . Annual Exam  . Hypertension  I,Jordan Gonzales M Jordan Gonzales,acting as a scribe for Jordan Sailors, PA-C.,have documented all relevant documentation on the behalf of Jordan Sailors, PA-C,as directed by  Jordan Sailors, PA-C while in the presence of Jordan Sailors, PA-C.  Subjective    Jordan Gonzales is a 44 y.o. male who presents today for a complete physical exam.  He reports consuming a general diet. Gym/ health club routine includes cardio, mod to heavy weightlifting and treadmill. He generally feels well. He reports sleeping well. He does have additional problems to discuss today.  HPI  Hypertension, follow-up  BP Readings from Last 3 Encounters:  10/07/20 (!) 165/94  09/07/19 (!) 157/85  08/08/19 139/84   Wt Readings from Last 3 Encounters:  10/07/20 214 lb 11.2 oz (97.4 kg)  09/07/19 210 lb (95.3 kg)  08/08/19 212 lb 12.8 oz (96.5 kg)     He was last seen for hypertension 14 months ago.  BP at that visit was 139/84. Management since that visit includes continue current medication.  He reports good compliance with treatment. He is not having side effects.  He is following a Regular diet. He is exercising. He does not smoke.  Use of agents associated with hypertension: none.   Outside blood pressures are normal. Patient reports extreme anxiety around blood work today.  Symptoms: No chest pain No chest pressure  No palpitations No syncope  No dyspnea No orthopnea  No paroxysmal nocturnal dyspnea No lower extremity edema   Pertinent labs: Lab Results  Component Value Date   CHOL 239 (H) 10/07/2020   HDL 44 10/07/2020   LDLCALC 159 (H) 10/07/2020   TRIG 194 (H) 10/07/2020   CHOLHDL 5.4 (H) 10/07/2020   Lab Results    Component Value Date   NA 140 10/07/2020   K 4.7 10/07/2020   CREATININE 1.00 10/07/2020   GFRNONAA 90 10/07/2020   GFRAA 105 10/07/2020   GLUCOSE 121 (H) 10/07/2020     The 10-year ASCVD risk score Denman George DC Jr., et al., 2013) is: 5.9%   --------------------------------------------------------------------------------------------------- Patient remains on allopurinol 300 mg daily for gout prophylaxis.   No past medical history on file. Past Surgical History:  Procedure Laterality Date  . EYE SURGERY    . MENISCUS REPAIR     Social History   Socioeconomic History  . Marital status: Married    Spouse name: Not on file  . Number of children: Not on file  . Years of education: Not on file  . Highest education level: Not on file  Occupational History  . Not on file  Tobacco Use  . Smoking status: Never Smoker  . Smokeless tobacco: Never Used  Vaping Use  . Vaping Use: Never used  Substance and Sexual Activity  . Alcohol use: Yes    Comment: on the weekends sometimes  . Drug use: No  . Sexual activity: Yes    Birth control/protection: None  Other Topics Concern  . Not on file  Social History Narrative  . Not on file   Social Determinants of Health   Financial Resource Strain:   . Difficulty of Paying Living Expenses: Not on file  Food Insecurity:   . Worried About  Running Out of Food in the Last Year: Not on file  . Ran Out of Food in the Last Year: Not on file  Transportation Needs:   . Lack of Transportation (Medical): Not on file  . Lack of Transportation (Non-Medical): Not on file  Physical Activity:   . Days of Exercise per Week: Not on file  . Minutes of Exercise per Session: Not on file  Stress:   . Feeling of Stress : Not on file  Social Connections:   . Frequency of Communication with Friends and Family: Not on file  . Frequency of Social Gatherings with Friends and Family: Not on file  . Attends Religious Services: Not on file  . Active Member of  Clubs or Organizations: Not on file  . Attends Banker Meetings: Not on file  . Marital Status: Not on file  Intimate Partner Violence:   . Fear of Current or Ex-Partner: Not on file  . Emotionally Abused: Not on file  . Physically Abused: Not on file  . Sexually Abused: Not on file   Family Status  Relation Name Status  . Mother  Alive  . Father  Alive  . Sister  Alive   Family History  Problem Relation Age of Onset  . Prostate cancer Father    No Known Allergies  Patient Care Team: Jordan Gonzales as PCP - General (Physician Assistant)   Medications: Outpatient Medications Prior to Visit  Medication Sig  . albuterol (VENTOLIN HFA) 108 (90 Base) MCG/ACT inhaler Inhale 1-2 puffs into the lungs every 6 (six) hours as needed for wheezing or shortness of breath.  Marland Kitchen amLODipine (NORVASC) 10 MG tablet Take 1 tablet (10 mg total) by mouth daily.  . chlorpheniramine-HYDROcodone (TUSSIONEX PENNKINETIC ER) 10-8 MG/5ML SUER Take 5 mLs by mouth at bedtime as needed.  . [DISCONTINUED] allopurinol (ZYLOPRIM) 300 MG tablet TAKE 1 TABLET BY MOUTH EVERY DAY  . [DISCONTINUED] predniSONE (DELTASONE) 10 MG tablet Start 60 mg po day one, then 50 mg po day two, taper by 10 mg daily until complete.  . [DISCONTINUED] amoxicillin (AMOXIL) 875 MG tablet Take 1 tablet (875 mg total) by mouth 2 (two) times daily.   No facility-administered medications prior to visit.    Review of Systems  Constitutional: Negative.   HENT: Negative.   Eyes: Negative.   Respiratory: Negative.   Cardiovascular: Negative.   Gastrointestinal: Negative.   Endocrine: Negative.   Genitourinary: Negative.   Musculoskeletal: Negative.   Skin: Negative.   Allergic/Immunologic: Negative.   Neurological: Negative.   Hematological: Negative.   Psychiatric/Behavioral: Negative.       Objective    BP (!) 165/94 (BP Location: Left Arm, Patient Position: Sitting, Cuff Size: Large)   Pulse 88   Temp  97.9 F (36.6 C) (Oral)   Ht 5\' 9"  (1.753 m)   Wt 214 lb 11.2 oz (97.4 kg)   SpO2 100%   BMI 31.71 kg/m    Physical Exam Constitutional:      Appearance: Normal appearance.  HENT:     Right Ear: Tympanic membrane, ear canal and external ear normal.     Left Ear: Tympanic membrane, ear canal and external ear normal.  Cardiovascular:     Rate and Rhythm: Normal rate and regular rhythm.     Pulses: Normal pulses.     Heart sounds: Normal heart sounds.  Pulmonary:     Effort: Pulmonary effort is normal.     Breath sounds: Normal breath  sounds.  Abdominal:     General: Abdomen is flat. Bowel sounds are normal.     Palpations: Abdomen is soft.  Skin:    General: Skin is warm and dry.  Neurological:     General: No focal deficit present.     Mental Status: He is alert and oriented to person, place, and time.  Psychiatric:        Mood and Affect: Mood is anxious.        Behavior: Behavior normal.     Comments: Patient extremely anxious about bloodwork.        Last depression screening scores PHQ 2/9 Scores 10/07/2020 08/08/2019 09/14/2017  PHQ - 2 Score 0 0 0  PHQ- 9 Score 0 - -   Last fall risk screening Fall Risk  10/07/2020  Falls in the past year? 0  Number falls in past yr: 0  Injury with Fall? 0  Risk for fall due to : No Fall Risks  Follow up Falls evaluation completed   Last Audit-C alcohol use screening Alcohol Use Disorder Test (AUDIT) 10/07/2020  1. How often do you have a drink containing alcohol? 2  2. How many drinks containing alcohol do you have on a typical day when you are drinking? 0  3. How often do you have six or more drinks on one occasion? 0  AUDIT-C Score 2   A score of 3 or more in women, and 4 or more in men indicates increased risk for alcohol abuse, EXCEPT if all of the points are from question 1   Results for orders placed or performed in visit on 10/07/20  TSH  Result Value Ref Range   TSH 0.879 0.450 - 4.500 uIU/mL  Lipid panel    Result Value Ref Range   Cholesterol, Total 239 (H) 100 - 199 mg/dL   Triglycerides 161194 (H) 0 - 149 mg/dL   HDL 44 >09>39 mg/dL   VLDL Cholesterol Cal 36 5 - 40 mg/dL   LDL Chol Calc (NIH) 604159 (H) 0 - 99 mg/dL   Chol/HDL Ratio 5.4 (H) 0.0 - 5.0 ratio  Comprehensive metabolic panel  Result Value Ref Range   Glucose 121 (H) 65 - 99 mg/dL   BUN 15 6 - 24 mg/dL   Creatinine, Ser 5.401.00 0.76 - 1.27 mg/dL   GFR calc non Af Amer 90 >59 mL/min/1.73   GFR calc Af Amer 105 >59 mL/min/1.73   BUN/Creatinine Ratio 15 9 - 20   Sodium 140 134 - 144 mmol/L   Potassium 4.7 3.5 - 5.2 mmol/L   Chloride 102 96 - 106 mmol/L   CO2 24 20 - 29 mmol/L   Calcium 10.0 8.7 - 10.2 mg/dL   Total Protein 7.7 6.0 - 8.5 g/dL   Albumin 4.9 4.0 - 5.0 g/dL   Globulin, Total 2.8 1.5 - 4.5 g/dL   Albumin/Globulin Ratio 1.8 1.2 - 2.2   Bilirubin Total 1.4 (H) 0.0 - 1.2 mg/dL   Alkaline Phosphatase 59 44 - 121 IU/L   AST 26 0 - 40 IU/L   ALT 30 0 - 44 IU/L  CBC with Differential/Platelet  Result Value Ref Range   WBC 4.8 3.4 - 10.8 x10E3/uL   RBC 4.78 4.14 - 5.80 x10E6/uL   Hemoglobin 15.8 13.0 - 17.7 g/dL   Hematocrit 98.145.6 19.137.5 - 51.0 %   MCV 95 79 - 97 fL   MCH 33.1 (H) 26.6 - 33.0 pg   MCHC 34.6 31 - 35 g/dL  RDW 12.9 11.6 - 15.4 %   Platelets 257 150 - 450 x10E3/uL   Neutrophils 44 Not Estab. %   Lymphs 39 Not Estab. %   Monocytes 12 Not Estab. %   Eos 4 Not Estab. %   Basos 1 Not Estab. %   Neutrophils Absolute 2.1 1.40 - 7.00 x10E3/uL   Lymphocytes Absolute 1.9 0 - 3 x10E3/uL   Monocytes Absolute 0.6 0 - 0 x10E3/uL   EOS (ABSOLUTE) 0.2 0.0 - 0.4 x10E3/uL   Basophils Absolute 0.1 0 - 0 x10E3/uL   Immature Granulocytes 0 Not Estab. %   Immature Grans (Abs) 0.0 0.0 - 0.1 x10E3/uL  Hepatitis C antibody  Result Value Ref Range   Hep C Virus Ab <0.1 0.0 - 0.9 s/co ratio    Assessment & Plan    Routine Health Maintenance and Physical Exam  Exercise Activities and Dietary recommendations Goals    None     Immunization History  Administered Date(s) Administered  . Influenza Inj Mdck Quad With Preservative 09/05/2018  . Influenza,inj,Quad PF,6+ Mos 09/14/2017, 08/08/2019  . Tdap 09/14/2017    Health Maintenance  Topic Date Due  . COVID-19 Vaccine (1) 10/23/2020 (Originally 11/03/1988)  . INFLUENZA VACCINE  02/17/2021 (Originally 06/20/2020)  . TETANUS/TDAP  09/15/2027  . Hepatitis C Screening  Completed  . HIV Screening  Completed    Discussed health benefits of physical activity, and encouraged him to engage in regular exercise appropriate for his age and condition.  1. Annual physical exam  - TSH - Lipid panel - Comprehensive metabolic panel - CBC with Differential/Platelet - Hepatitis C antibody  2. Essential hypertension  Blood pressure high today but patient extremely anxious about blood draw. Continue current medications and advised patient to check his BP at home and return if still elevated.   3. Prediabetes  - HgB A1c  4. History of gout  - allopurinol (ZYLOPRIM) 300 MG tablet; Take 1 tablet (300 mg total) by mouth daily.  Dispense: 90 tablet; Refill: 3   Return in about 1 year (around 10/07/2021) for CPE and follow up.     ITrey Sailors, PA-C, have reviewed all documentation for this visit. The documentation on 10/08/20 for the exam, diagnosis, procedures, and orders are all accurate and complete.  The entirety of the information documented in the History of Present Illness, Review of Systems and Physical Exam were personally obtained by me. Portions of this information were initially documented by [facility] and reviewed by me for thoroughness and accuracy.     Jordan Gonzales  [facility] [phone] (phone) [phone] (fax)  [facility]

## 2020-10-07 ENCOUNTER — Other Ambulatory Visit: Payer: Self-pay

## 2020-10-07 ENCOUNTER — Ambulatory Visit: Payer: BC Managed Care – PPO | Admitting: Physician Assistant

## 2020-10-07 ENCOUNTER — Encounter: Payer: Self-pay | Admitting: Physician Assistant

## 2020-10-07 VITALS — BP 165/94 | HR 88 | Temp 97.9°F | Ht 69.0 in | Wt 214.7 lb

## 2020-10-07 DIAGNOSIS — R7303 Prediabetes: Secondary | ICD-10-CM

## 2020-10-07 DIAGNOSIS — Z8739 Personal history of other diseases of the musculoskeletal system and connective tissue: Secondary | ICD-10-CM

## 2020-10-07 DIAGNOSIS — I10 Essential (primary) hypertension: Secondary | ICD-10-CM

## 2020-10-07 DIAGNOSIS — Z0000 Encounter for general adult medical examination without abnormal findings: Secondary | ICD-10-CM

## 2020-10-08 LAB — COMPREHENSIVE METABOLIC PANEL
ALT: 30 IU/L (ref 0–44)
AST: 26 IU/L (ref 0–40)
Albumin/Globulin Ratio: 1.8 (ref 1.2–2.2)
Albumin: 4.9 g/dL (ref 4.0–5.0)
Alkaline Phosphatase: 59 IU/L (ref 44–121)
BUN/Creatinine Ratio: 15 (ref 9–20)
BUN: 15 mg/dL (ref 6–24)
Bilirubin Total: 1.4 mg/dL — ABNORMAL HIGH (ref 0.0–1.2)
CO2: 24 mmol/L (ref 20–29)
Calcium: 10 mg/dL (ref 8.7–10.2)
Chloride: 102 mmol/L (ref 96–106)
Creatinine, Ser: 1 mg/dL (ref 0.76–1.27)
GFR calc Af Amer: 105 mL/min/{1.73_m2} (ref >59)
GFR calc non Af Amer: 90 mL/min/{1.73_m2} (ref >59)
Globulin, Total: 2.8 g/dL (ref 1.5–4.5)
Glucose: 121 mg/dL — ABNORMAL HIGH (ref 65–99)
Potassium: 4.7 mmol/L (ref 3.5–5.2)
Sodium: 140 mmol/L (ref 134–144)
Total Protein: 7.7 g/dL (ref 6.0–8.5)

## 2020-10-08 LAB — CBC WITH DIFFERENTIAL/PLATELET
Basophils Absolute: 0.1 10*3/uL (ref 0.0–0.2)
Basos: 1 %
EOS (ABSOLUTE): 0.2 10*3/uL (ref 0.0–0.4)
Eos: 4 %
Hematocrit: 45.6 % (ref 37.5–51.0)
Hemoglobin: 15.8 g/dL (ref 13.0–17.7)
Immature Grans (Abs): 0 10*3/uL (ref 0.0–0.1)
Immature Granulocytes: 0 %
Lymphocytes Absolute: 1.9 10*3/uL (ref 0.7–3.1)
Lymphs: 39 %
MCH: 33.1 pg — ABNORMAL HIGH (ref 26.6–33.0)
MCHC: 34.6 g/dL (ref 31.5–35.7)
MCV: 95 fL (ref 79–97)
Monocytes Absolute: 0.6 10*3/uL (ref 0.1–0.9)
Monocytes: 12 %
Neutrophils Absolute: 2.1 10*3/uL (ref 1.4–7.0)
Neutrophils: 44 %
Platelets: 257 10*3/uL (ref 150–450)
RBC: 4.78 x10E6/uL (ref 4.14–5.80)
RDW: 12.9 % (ref 11.6–15.4)
WBC: 4.8 10*3/uL (ref 3.4–10.8)

## 2020-10-08 LAB — LIPID PANEL
Chol/HDL Ratio: 5.4 ratio — ABNORMAL HIGH (ref 0.0–5.0)
Cholesterol, Total: 239 mg/dL — ABNORMAL HIGH (ref 100–199)
HDL: 44 mg/dL (ref >39)
LDL Chol Calc (NIH): 159 mg/dL — ABNORMAL HIGH (ref 0–99)
Triglycerides: 194 mg/dL — ABNORMAL HIGH (ref 0–149)
VLDL Cholesterol Cal: 36 mg/dL (ref 5–40)

## 2020-10-08 LAB — TSH: TSH: 0.879 u[IU]/mL (ref 0.450–4.500)

## 2020-10-08 LAB — HEPATITIS C ANTIBODY: Hep C Virus Ab: <0.1 s/co ratio (ref 0.0–0.9)

## 2020-10-08 MED ORDER — ALLOPURINOL 300 MG PO TABS: 300.0000 mg | ORAL_TABLET | Freq: Every day | ORAL | 3 refills | Status: DC

## 2020-11-02 ENCOUNTER — Other Ambulatory Visit: Payer: Self-pay | Admitting: Physician Assistant

## 2020-11-02 DIAGNOSIS — I10 Essential (primary) hypertension: Secondary | ICD-10-CM

## 2020-11-02 MED ORDER — AMLODIPINE BESYLATE 10 MG PO TABS: 10.0000 mg | ORAL_TABLET | Freq: Every day | ORAL | 3 refills | Status: DC

## 2020-11-02 NOTE — Telephone Encounter (Signed)
Noted in OV notes to return in 1 year, refill x 1 year given.

## 2020-11-02 NOTE — Telephone Encounter (Signed)
Medication Refill - Medication:amLODipine (NORVASC) 10 MG tablet (Patient is completely out of medication)   Has the patient contacted their pharmacy? yes (Agent: If no, request that the patient contact the pharmacy for the refill.) (Agent: If yes, when and what did the pharmacy advise?)Contact PCP  Preferred Pharmacy (with phone number or [phone]re[facility], [place] - [address] Phone:  [phone]  Fax:  [phone]       Agent: Please be advised that RX refills may take up to 3 business days. We ask that you follow-up with your pharmacy.

## 2021-10-05 ENCOUNTER — Telehealth: Payer: Self-pay | Admitting: Physician Assistant

## 2021-10-05 NOTE — Telephone Encounter (Signed)
[facility] for the following medications:  allopurinol (ZYLOPRIM) 300 MG tablet   Please advise.

## 2021-10-06 NOTE — Telephone Encounter (Signed)
Denied-patient needs office visit.  ?

## 2021-10-10 ENCOUNTER — Other Ambulatory Visit: Payer: Self-pay

## 2021-10-10 DIAGNOSIS — Z8739 Personal history of other diseases of the musculoskeletal system and connective tissue: Secondary | ICD-10-CM

## 2021-10-10 NOTE — Telephone Encounter (Signed)
Requested medications are due for refill today.  yes  Requested medications are on the active medications list.  yes  Last refill. 10/08/2020  Future visit scheduled.   no  Notes to clinic.  Labs are expired. Pt needs appointment.

## 2021-10-10 NOTE — Telephone Encounter (Signed)
Copied from CRM [id number]. Topic: Quick Communication - Rx Refill/Question >> Oct 10, 2021  4:10 PM Izora Ribas, [place] A wrote: Medication: allopurinol (ZYLOPRIM) 300 MG tablet [[id number]]   Has the patient contacted their pharmacy? Yes.  The patient has been directed to contact their PCP (Agent: If no, request that the patient contact the pharmacy for the refill. If patient does not wish to contact the pharmacy document the reason why and proceed with request.) (Agent: If yes, when and what did the pharmacy advise?)  Preferred Pharmacy (with phone number or street name): [id number] Dan [facility]reys, [place] - [address]  Phone:  [phone] Fax:  [phone]    Has the patient been seen for an appointment in the last year OR does the patient have an upcoming appointment? No.  Agent: Please be advised that RX refills may take up to 3 business days. We ask that you follow-up with your pharmacy.

## 2021-10-27 ENCOUNTER — Ambulatory Visit: Payer: BC Managed Care – PPO | Admitting: Physician Assistant

## 2021-10-27 ENCOUNTER — Other Ambulatory Visit: Payer: Self-pay | Admitting: Physician Assistant

## 2021-10-27 ENCOUNTER — Other Ambulatory Visit: Payer: Self-pay

## 2021-10-27 ENCOUNTER — Encounter: Payer: Self-pay | Admitting: Physician Assistant

## 2021-10-27 VITALS — BP 183/83 | HR 129 | Temp 98.3°F | Ht 69.0 in | Wt 215.0 lb

## 2021-10-27 DIAGNOSIS — E782 Mixed hyperlipidemia: Secondary | ICD-10-CM

## 2021-10-27 DIAGNOSIS — M109 Gout, unspecified: Secondary | ICD-10-CM | POA: Insufficient documentation

## 2021-10-27 DIAGNOSIS — R0981 Nasal congestion: Secondary | ICD-10-CM

## 2021-10-27 DIAGNOSIS — I10 Essential (primary) hypertension: Secondary | ICD-10-CM

## 2021-10-27 DIAGNOSIS — F418 Other specified anxiety disorders: Secondary | ICD-10-CM

## 2021-10-27 DIAGNOSIS — Z1211 Encounter for screening for malignant neoplasm of colon: Secondary | ICD-10-CM

## 2021-10-27 DIAGNOSIS — Z8739 Personal history of other diseases of the musculoskeletal system and connective tissue: Secondary | ICD-10-CM | POA: Diagnosis not present

## 2021-10-27 DIAGNOSIS — R739 Hyperglycemia, unspecified: Secondary | ICD-10-CM

## 2021-10-27 DIAGNOSIS — Z23 Encounter for immunization: Secondary | ICD-10-CM

## 2021-10-27 MED ORDER — AMLODIPINE BESYLATE 10 MG PO TABS: 10.0000 mg | ORAL_TABLET | Freq: Every day | ORAL | 3 refills | Status: DC

## 2021-10-27 MED ORDER — ALLOPURINOL 300 MG PO TABS
300.0000 mg | ORAL_TABLET | Freq: Every day | ORAL | 3 refills | Status: DC
Start: 2021-10-27 — End: 2022-10-25

## 2021-10-27 MED ORDER — AZELASTINE HCL 0.1 % NA SOLN: 1.0000 | Freq: Two times a day (BID) | NASAL | 3 refills | Status: DC

## 2021-10-27 MED ORDER — AZELASTINE-FLUTICASONE 137-50 MCG/ACT NA SUSP: 1.0000 | Freq: Two times a day (BID) | NASAL | 1 refills | Status: DC

## 2021-10-27 MED ORDER — PROPRANOLOL HCL 10 MG PO TABS: 10.0000 mg | ORAL_TABLET | ORAL | 1 refills | Status: DC | PRN

## 2021-10-27 NOTE — Assessment & Plan Note (Signed)
Elevated in office today, but after speaking with Thayer Ohm I believe this is largely due to his situational anxiety.  His situational anxiety around the medical world is severe enough that I feel it was not appropriate to repeat his blood pressure at this time.  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  I suggested he find out what the range of blood pressures they use is.   F/u 4 mo

## 2021-10-27 NOTE — Assessment & Plan Note (Signed)
Residual from recent URI.  Advised to try azelastine/Flonase nasal spray.  Encouraged taking showers, saline nasal spray.

## 2021-10-27 NOTE — Assessment & Plan Note (Signed)
Particularly around health, the medical world and going to the doctors.  We discussed this at length, reassured him that this is not unusual and not shameful.  We discussed trying propranolol 10 mg before an event that would make him acutely anxious, i.e. getting his blood drawn, coming to the doctors.  He also mentioned that he gets anxious before big exams and does have 1 coming up.  I advised that this would be an appropriate time to take this medication as well.  Take propranolol about 30 minutes before an event that would bring on anxiety.  I advised that he try this medication before his test just to see how it made him feel.  We will follow-up about the situational anxiety and how this medication is helping in 4 months.

## 2021-10-27 NOTE — Progress Notes (Signed)
Pharamcy did not have combo

## 2021-10-27 NOTE — Progress Notes (Signed)
Date:  10/27/2021   Name:  Jordan Gonzales   DOB:  [birth year]   MRN:  [id number]   Chief Complaint: Gout and Hypertension follow up  Jordan Gonzales is a 45 y/o male who presents today for follow up of chronic gout and HTN.  He has been taking allopurinol 300 mg once a day for years.  States that this controls his flares very well.  Has not had a flareup in over a year.  Typically if he is to have a gout flare it happens in his right big toe.  Jordan Gonzales admits to a significant amount of medical anxiety.  He states that he does not really experience anxiety in other situations aside from coming to the doctor's office.  He can feel his heart race, feel his blood pressure rise and gets very acutely anxious.  He believes this reflects in his blood pressure.  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  They have a blood pressure threshold and he is not allowed to return to the field if it is over certain number.  He is unsure what that range is that he always falls within the appropriate range.  He reports that his entire family was sick the week before Thanksgiving which she recovered from but nasal congestion has persisted.  Denies current fevers, chills, muscle aches, headaches, shortness of breath, chest pain, wheezing.  Lab Results  Component Value Date   NA 140 10/07/2020   K 4.7 10/07/2020   CO2 24 10/07/2020   GLUCOSE 121 (H) 10/07/2020   BUN 15 10/07/2020   CREATININE 1.00 10/07/2020   CALCIUM 10.0 10/07/2020   GFRNONAA 90 10/07/2020   Lab Results  Component Value Date   CHOL 239 (H) 10/07/2020   HDL 44 10/07/2020   LDLCALC 159 (H) 10/07/2020   TRIG 194 (H) 10/07/2020   CHOLHDL 5.4 (H) 10/07/2020   Lab Results  Component Value Date   TSH 0.879 10/07/2020   Lab Results  Component Value Date   HGBA1C 5.7 (H) 08/08/2019   Lab Results  Component Value Date   WBC 4.8 10/07/2020   HGB 15.8 10/07/2020   HCT 45.6 10/07/2020   MCV 95 10/07/2020    PLT 257 10/07/2020   Lab Results  Component Value Date   ALT 30 10/07/2020   AST 26 10/07/2020   ALKPHOS 59 10/07/2020   BILITOT 1.4 (H) 10/07/2020   No results found for: 25OHVITD2, 25OHVITD3, VD25OH   Review of Systems  Constitutional:  Negative for fatigue and fever.  HENT:  Positive for congestion.   Respiratory:  Negative for cough, shortness of breath and wheezing.   Cardiovascular:  Negative for chest pain.  Psychiatric/Behavioral:  The patient is nervous/anxious.    Patient Active Problem List   Diagnosis Date Noted   Primary hypertension 10/27/2021   Gout 10/27/2021   Situational anxiety 10/27/2021   Nasal congestion 10/27/2021   HLD (hyperlipidemia) 03/09/2017   History of gout 03/09/2017    No Known Allergies  Past Surgical History:  Procedure Laterality Date   EYE SURGERY     MENISCUS REPAIR      Social History   Tobacco Use   Smoking status: Never   Smokeless tobacco: Never  Vaping Use   Vaping Use: Never used  Substance Use Topics   Alcohol use: Yes    Comment: on the weekends sometimes   Drug use: No     Medication list has been reviewed  and updated.  Allergies as of 10/27/2021   No Known Allergies      Medication List        Accurate as of October 27, 2021 10:48 AM. If you have any questions, ask your nurse or doctor.          STOP taking these medications    albuterol 108 (90 Base) MCG/ACT inhaler Commonly known as: VENTOLIN HFA Stopped by: Alfredia Ferguson, PA-C   chlorpheniramine-HYDROcodone 10-8 MG/5ML Suer Commonly known as: Tussionex Pennkinetic ER Stopped by: Alfredia Ferguson, PA-C       TAKE these medications    allopurinol 300 MG tablet Commonly known as: ZYLOPRIM Take 1 tablet (300 mg total) by mouth daily.   amLODipine 10 MG tablet Commonly known as: NORVASC Take 1 tablet (10 mg total) by mouth daily.   Azelastine-Fluticasone 137-50 MCG/ACT Susp Place 1 spray into the nose every 12 (twelve)  hours. Started by: Alfredia Ferguson, PA-C   propranolol 10 MG tablet Commonly known as: INDERAL Take 1 tablet (10 mg total) by mouth as needed (prn for anxiety). Started by: Alfredia Ferguson, PA-C           [facility] 2/9 Scores 10/27/2021 10/07/2020 08/08/2019 09/14/2017  PHQ - 2 Score 0 0 0 0  PHQ- 9 Score 0 0 - -    No flowsheet data found.  BP Readings from Last 3 Encounters:  10/27/21 (!) 183/83  10/07/20 (!) 165/94  09/07/19 (!) 157/85    Physical Exam Constitutional:      General: He is awake.     Appearance: He is well-developed.  HENT:     Head: Normocephalic.  Eyes:     Conjunctiva/sclera: Conjunctivae normal.  Cardiovascular:     Rate and Rhythm: Regular rhythm. Tachycardia present.     Pulses: Normal pulses.     Heart sounds: Normal heart sounds.  Pulmonary:     Effort: Pulmonary effort is normal.     Breath sounds: Normal breath sounds.  Skin:    General: Skin is warm.  Neurological:     Mental Status: He is alert and oriented to person, place, and time.  Psychiatric:        Attention and Perception: Attention normal.        Mood and Affect: Mood is anxious.        Speech: Speech normal.        Behavior: Behavior is cooperative.    Wt Readings from Last 3 Encounters:  10/27/21 215 lb (97.5 kg)  10/07/20 214 lb 11.2 oz (97.4 kg)  09/07/19 210 lb (95.3 kg)    BP (!) 183/83   Pulse (!) 129   Temp 98.3 F (36.8 C) (Oral)   Ht 5\' 9"  (1.753 m)   Wt 215 lb (97.5 kg)   SpO2 99%   BMI 31.75 kg/m   Assessment and Plan:  Problem List Items Addressed This Visit       Cardiovascular and Mediastinum   Primary hypertension - Primary    Elevated in office today, but after speaking with I believe this is largely due to his situational anxiety.  His situational anxiety around the medical world is severe enough that I feel it was not appropriate to repeat his blood pressure at this time.  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  I suggested he find out what the range of blood pressures they use is.  F/u 4 mo        Relevant Medications   propranolol (INDERAL) 10 MG tablet   amLODipine (NORVASC) 10 MG tablet   Other Relevant Orders   Comprehensive metabolic panel     Other   HLD (hyperlipidemia)   Relevant Medications   propranolol (INDERAL) 10 MG tablet   amLODipine (NORVASC) 10 MG tablet   Other Relevant Orders   Comprehensive metabolic panel   Lipid panel   History of gout    Well controlled with allopurinol.  Will refill. Uric acid baseline      Relevant Medications   allopurinol (ZYLOPRIM) 300 MG tablet   Other Relevant Orders   Uric acid   Situational anxiety    Particularly around health, the medical world and going to the doctors.  We discussed this at length, reassured him that this is not unusual and not shameful.  We discussed trying propranolol 10 mg before an event that would make him acutely anxious, i.e. getting his blood drawn, coming to the doctors.  He also mentioned that he gets anxious before big exams and does have 1 coming up.  I advised that this would be an appropriate time to take this medication as well.  Take propranolol about 30 minutes before an event that would bring on anxiety.  I advised that he try this medication before his test just to see how it made him feel.  We will follow-up about the situational anxiety and how this medication is helping in 4 months.      Relevant Medications   propranolol (INDERAL) 10 MG tablet   Nasal congestion    Residual from recent URI.  Advised to try azelastine/Flonase nasal spray.  Encouraged taking showers, saline nasal spray.      Relevant Medications   Azelastine-Fluticasone 137-50 MCG/ACT SUSP   Other Visit Diagnoses     Hyperglycemia       Relevant Orders   HgB A1c   Colon cancer screening       Relevant Orders   Ambulatory referral to Gastroenterology   Need for immunization against  influenza       Relevant Orders   Flu Vaccine QUAD 33mo+IM (Fluarix, Fluzone & Alfiuria Quad PF) (Completed)      Return in about 4 months (around 02/25/2022) for hypertension, anxiety.  I, Alfredia Ferguson, PA-C have reviewed all documentation for this visit. The documentation on  10/27/2021  for the exam, diagnosis, procedures, and orders are all accurate and complete.  Alfredia Ferguson, PA-C [facility] [address] [place], [place], [address] Office: [phone] Fax: [phone]

## 2021-10-27 NOTE — Assessment & Plan Note (Signed)
Well controlled with allopurinol.  Will refill. Uric acid baseline

## 2021-10-28 ENCOUNTER — Other Ambulatory Visit: Payer: Self-pay

## 2021-10-28 LAB — LIPID PANEL
Chol/HDL Ratio: 4.9 ratio (ref 0.0–5.0)
Cholesterol, Total: 204 mg/dL — ABNORMAL HIGH (ref 100–199)
HDL: 42 mg/dL (ref >39)
LDL Chol Calc (NIH): 135 mg/dL — ABNORMAL HIGH (ref 0–99)
Triglycerides: 151 mg/dL — ABNORMAL HIGH (ref 0–149)
VLDL Cholesterol Cal: 27 mg/dL (ref 5–40)

## 2021-10-28 LAB — COMPREHENSIVE METABOLIC PANEL
ALT: 39 IU/L (ref 0–44)
AST: 25 IU/L (ref 0–40)
Albumin/Globulin Ratio: 1.8 (ref 1.2–2.2)
Albumin: 4.7 g/dL (ref 4.0–5.0)
Alkaline Phosphatase: 62 IU/L (ref 44–121)
BUN/Creatinine Ratio: 14 (ref 9–20)
BUN: 14 mg/dL (ref 6–24)
Bilirubin Total: 1.4 mg/dL — ABNORMAL HIGH (ref 0.0–1.2)
CO2: 23 mmol/L (ref 20–29)
Calcium: 9.5 mg/dL (ref 8.7–10.2)
Chloride: 101 mmol/L (ref 96–106)
Creatinine, Ser: 0.97 mg/dL (ref 0.76–1.27)
Globulin, Total: 2.6 g/dL (ref 1.5–4.5)
Glucose: 118 mg/dL — ABNORMAL HIGH (ref 70–99)
Potassium: 4.6 mmol/L (ref 3.5–5.2)
Sodium: 140 mmol/L (ref 134–144)
Total Protein: 7.3 g/dL (ref 6.0–8.5)
eGFR: 98 mL/min/{1.73_m2} (ref >59)

## 2021-10-28 LAB — HEMOGLOBIN A1C
Est. average glucose Bld gHb Est-mCnc: 111 mg/dL
Hgb A1c MFr Bld: 5.5 % (ref 4.8–5.6)

## 2021-10-28 LAB — URIC ACID: Uric Acid: 5.3 mg/dL (ref 3.8–8.4)

## 2021-10-28 MED ORDER — PEG 3350-KCL-NA BICARB-NACL 420 G PO SOLR: 4000.0000 mL | Freq: Once | ORAL | 0 refills | Status: AC

## 2021-11-19 ENCOUNTER — Other Ambulatory Visit: Payer: Self-pay | Admitting: Physician Assistant

## 2021-11-19 DIAGNOSIS — F418 Other specified anxiety disorders: Secondary | ICD-10-CM

## 2021-11-24 ENCOUNTER — Encounter: Payer: Self-pay | Admitting: Gastroenterology

## 2021-12-16 ENCOUNTER — Ambulatory Visit: Payer: BC Managed Care – PPO | Admitting: Anesthesiology

## 2021-12-16 ENCOUNTER — Ambulatory Visit
Admission: RE | Admit: 2021-12-16 | Discharge: 2021-12-16 | Disposition: A | Discharge status: Home / Self Care | Payer: BC Managed Care – PPO | Attending: Gastroenterology | Admitting: Gastroenterology

## 2021-12-16 ENCOUNTER — Encounter: Payer: Self-pay | Admitting: Gastroenterology

## 2021-12-16 ENCOUNTER — Other Ambulatory Visit: Payer: Self-pay

## 2021-12-16 ENCOUNTER — Encounter
Admission: RE | Disposition: A | Discharge status: Home / Self Care | Payer: Self-pay | Source: Home / Self Care | Attending: Gastroenterology

## 2021-12-16 DIAGNOSIS — I10 Essential (primary) hypertension: Secondary | ICD-10-CM | POA: Diagnosis not present

## 2021-12-16 DIAGNOSIS — K640 First degree hemorrhoids: Secondary | ICD-10-CM | POA: Insufficient documentation

## 2021-12-16 DIAGNOSIS — Z1211 Encounter for screening for malignant neoplasm of colon: Secondary | ICD-10-CM

## 2021-12-16 DIAGNOSIS — K635 Polyp of colon: Secondary | ICD-10-CM

## 2021-12-16 HISTORY — DX: Essential (primary) hypertension: I10

## 2021-12-16 HISTORY — DX: Gout, unspecified: M10.9

## 2021-12-16 HISTORY — PX: COLONOSCOPY: SHX5424

## 2021-12-16 SURGERY — COLONOSCOPY
Anesthesia: General

## 2021-12-16 MED ORDER — ONDANSETRON HCL 4 MG/2ML IJ SOLN: 4.0000 mg | Freq: Once | INTRAMUSCULAR | Status: DC | PRN

## 2021-12-16 MED ORDER — SODIUM CHLORIDE 0.9 % IV SOLN: INTRAVENOUS | Status: DC

## 2021-12-16 MED ORDER — ACETAMINOPHEN 160 MG/5ML PO SOLN: 325.0000 mg | ORAL | Status: DC | PRN

## 2021-12-16 MED ORDER — LIDOCAINE HCL (CARDIAC) PF 100 MG/5ML IV SOSY
PREFILLED_SYRINGE | INTRAVENOUS | Status: DC | PRN
  Administered 2021-12-16: 30 mg via INTRAVENOUS

## 2021-12-16 MED ORDER — LACTATED RINGERS IV SOLN: INTRAVENOUS | Status: DC

## 2021-12-16 MED ORDER — ACETAMINOPHEN 325 MG PO TABS: 325.0000 mg | ORAL_TABLET | ORAL | Status: DC | PRN

## 2021-12-16 MED ORDER — PROPOFOL 10 MG/ML IV BOLUS
INTRAVENOUS | Status: DC | PRN
  Administered 2021-12-16: 30 mg via INTRAVENOUS
  Administered 2021-12-16: 20 mg via INTRAVENOUS
  Administered 2021-12-16: 40 mg via INTRAVENOUS
  Administered 2021-12-16: 150 mg via INTRAVENOUS
  Administered 2021-12-16 (×3): 40 mg via INTRAVENOUS

## 2021-12-16 SURGICAL SUPPLY — 22 items

## 2021-12-16 NOTE — H&P (Signed)
° °  Midge Minium, MD [facility] [address], [address] [place], [place] [address] Phone: [phone] Fax : [phone]  Primary Care Physician:  Alfredia Ferguson, PA-C Primary Gastroenterologist:  Dr. Servando Snare  Pre-Procedure History & Physical: HPI:  Jordan Gonzales is a 46 y.o. male is here for a screening colonoscopy.   Past Medical History:  Diagnosis Date   Gout    Hypertension     Past Surgical History:  Procedure Laterality Date   EYE SURGERY     MENISCUS REPAIR      Prior to Admission medications   Medication Sig Start Date End Date Taking? Authorizing Provider  allopurinol (ZYLOPRIM) 300 MG tablet Take 1 tablet (300 mg total) by mouth daily. 10/27/21  Yes Alfredia Ferguson, PA-C  amLODipine (NORVASC) 10 MG tablet Take 1 tablet (10 mg total) by mouth daily. 10/27/21  Yes Drubel, Lillia Abed, PA-C  azelastine (ASTELIN) 0.1 % nasal spray Place 1 spray into both nostrils 2 (two) times daily. Use in each nostril as directed Patient not taking: Reported on 11/24/2021 10/27/21   Drubel, Lillia Abed, PA-C  propranolol (INDERAL) 10 MG tablet Take 1 tablet (10 mg total) by mouth as needed (prn for anxiety). 10/27/21   Alfredia Ferguson, PA-C    Allergies as of 10/28/2021   (No Known Allergies)    Family History  Problem Relation Age of Onset   Prostate cancer Father     Social History   Socioeconomic History   Marital status: Married    Spouse name: Not on file   Number of children: Not on file   Years of education: Not on file   Highest education level: Not on file  Occupational History   Not on file  Tobacco Use   Smoking status: Never   Smokeless tobacco: Never  Vaping Use   Vaping Use: Never used  Substance and Sexual Activity   Alcohol use: Yes    Comment: on the weekends sometimes   Drug use: No   Sexual activity: Yes    Birth control/protection: None  Other Topics Concern   Not on file  Social History Narrative   Not on file   Social Determinants of Health    Financial Resource Strain: Not on file  Food Insecurity: Not on file  Transportation Needs: Not on file  Physical Activity: Not on file  Stress: Not on file  Social Connections: Not on file  Intimate Partner Violence: Not on file    Review of Systems: See HPI, otherwise negative ROS  Physical Exam: BP (!) 179/82    Pulse (!) 101    Temp 98.1 F (36.7 C) (Temporal)    Ht 5\' 9"  (1.753 m)    Wt 94.8 kg    SpO2 99%    BMI 30.86 kg/m  General:   Alert,  pleasant and cooperative in NAD Head:  Normocephalic and atraumatic. Neck:  Supple; no masses or thyromegaly. Lungs:  Clear throughout to auscultation.    Heart:  Regular rate and rhythm. Abdomen:  Soft, nontender and nondistended. Normal bowel sounds, without guarding, and without rebound.   Neurologic:  Alert and  oriented x4;  grossly normal neurologically.  Impression/Plan: Jordan Gonzales is now here to undergo a screening colonoscopy.  Risks, benefits, and alternatives regarding colonoscopy have been reviewed with the patient.  Questions have been answered.  All parties agreeable.

## 2021-12-16 NOTE — Op Note (Signed)
[facility] Patient Name: Jordan Gonzales Procedure Date: 12/16/2021 9:41 AM MRN: [id number] Account #: [id number] Date of Birth: 06-15-1976 Admit Type: Outpatient Age: 46 Room: [facility] OR ROOM 01 Gender: Male Note Status: Finalized Instrument Name: [id number] Procedure:             Colonoscopy Indications:           Screening for colorectal malignant neoplasm Providers:             Midge Minium MD, MD Referring MD:          Alfredia Ferguson PA Medicines:             Propofol per Anesthesia Complications:         No immediate complications. Procedure:             Pre-Anesthesia Assessment:                        - Prior to the procedure, a History and Physical was                         performed, and patient medications and allergies were                         reviewed. The patient's tolerance of previous                         anesthesia was also reviewed. The risks and benefits                         of the procedure and the sedation options and risks                         were discussed with the patient. All questions were                         answered, and informed consent was obtained. Prior                         Anticoagulants: The patient has taken no previous                         anticoagulant or antiplatelet agents. ASA Grade                         Assessment: II - A patient with mild systemic disease.                         After reviewing the risks and benefits, the patient                         was deemed in satisfactory condition to undergo the                         procedure.                        After obtaining informed consent, the colonoscope was  passed under direct vision. Throughout the procedure,                         the patient's blood pressure, pulse, and oxygen                         saturations were monitored continuously. The                         Colonoscope was  introduced through the anus and                         advanced to the the cecum, identified by appendiceal                         orifice and ileocecal valve. The colonoscopy was                         performed without difficulty. The patient tolerated                         the procedure well. The quality of the bowel                         preparation was excellent. Findings:      The perianal and digital rectal examinations were normal.      A 2 mm polyp was found in the sigmoid colon. The polyp was sessile. The       polyp was removed with a cold biopsy forceps. Resection and retrieval       were complete.      Non-bleeding internal hemorrhoids were found during retroflexion. The       hemorrhoids were Grade I (internal hemorrhoids that do not prolapse). Impression:            - One 2 mm polyp in the sigmoid colon, removed with a                         cold biopsy forceps. Resected and retrieved.                        - Non-bleeding internal hemorrhoids. Recommendation:        - If the pathology report reveals adenomatous tissue,                         then repeat the colonoscopy for surveillance in 7                         years otherwise 10 years. Procedure Code(s):     --- Professional ---                        [id number], Colonoscopy, flexible; with biopsy, single or                         multiple Diagnosis Code(s):     --- Professional ---                        Z12.11, Encounter for screening for malignant neoplasm  of colon                        K63.5, Polyp of colon CPT copyright 2019 American Medical Association. All rights reserved. The codes documented in this report are preliminary and upon coder review may  be revised to meet current compliance requirements. Midge Minium MD, MD 12/16/2021 10:00:54 AM This report has been signed electronically. Number of Addenda: 0 Note Initiated On: 12/16/2021 9:41 AM Scope Withdrawal Time: 0 hours 7  minutes 44 seconds  Total Procedure Duration: 0 hours 9 minutes 53 seconds  Estimated Blood Loss:  Estimated blood loss: none. Estimated blood loss: none.      [facility]

## 2021-12-16 NOTE — Anesthesia Preprocedure Evaluation (Signed)
Anesthesia Evaluation  Patient identified by MRN, date of birth, ID band Patient awake    Reviewed: Allergy & Precautions, NPO status   Airway Mallampati: II  TM Distance: >3 FB     Dental   Pulmonary neg pulmonary ROS,    Pulmonary exam normal        Cardiovascular hypertension,  Rhythm:Regular Rate:Normal     Neuro/Psych    GI/Hepatic   Endo/Other    Renal/GU      Musculoskeletal Gout    Abdominal   Peds  Hematology   Anesthesia Other Findings   Reproductive/Obstetrics                             Anesthesia Physical Anesthesia Plan  ASA: 2  Anesthesia Plan: General   Post-op Pain Management:    Induction: Intravenous  PONV Risk Score and Plan: Propofol infusion, TIVA and Treatment may vary due to age or medical condition  Airway Management Planned: Natural Airway and Nasal Cannula  Additional Equipment:   Intra-op Plan:   Post-operative Plan:   Informed Consent: I have reviewed the patients History and Physical, chart, labs and discussed the procedure including the risks, benefits and alternatives for the proposed anesthesia with the patient or authorized representative who has indicated his/her understanding and acceptance.       Plan Discussed with: CRNA  Anesthesia Plan Comments:         Anesthesia Quick Evaluation

## 2021-12-16 NOTE — Anesthesia Procedure Notes (Signed)
Date/Time: 12/16/2021 9:45 AM Performed by: Maree Krabbe, CRNA Pre-anesthesia Checklist: Patient identified, Emergency Drugs available, Suction available, Timeout performed and Patient being monitored Patient Re-evaluated:Patient Re-evaluated prior to induction Oxygen Delivery Method: Nasal cannula Placement Confirmation: positive ETCO2

## 2021-12-16 NOTE — Transfer of Care (Signed)
Immediate Anesthesia Transfer of Care Note  Patient: Jordan Gonzales [facility]  Procedure(s) Performed: COLONOSCOPY  Patient Location: PACU  Anesthesia Type: General  Level of Consciousness: awake, alert  and patient cooperative  Airway and Oxygen Therapy: Patient Spontanous Breathing and Patient connected to supplemental oxygen  Post-op Assessment: Post-op Vital signs reviewed, Patient's Cardiovascular Status Stable, Respiratory Function Stable, Patent Airway and No signs of Nausea or vomiting  Post-op Vital Signs: Reviewed and stable  Complications: No notable events documented.

## 2021-12-16 NOTE — Anesthesia Postprocedure Evaluation (Signed)
Anesthesia Post Note  Patient: Jordan Gonzales [facility]  Procedure(s) Performed: COLONOSCOPY     Patient location during evaluation: PACU Anesthesia Type: General Level of consciousness: awake Pain management: pain level controlled Vital Signs Assessment: post-procedure vital signs reviewed and stable Respiratory status: respiratory function stable Cardiovascular status: stable Postop Assessment: no signs of nausea or vomiting Anesthetic complications: no   No notable events documented.  Jola Babinski

## 2021-12-19 ENCOUNTER — Encounter: Payer: Self-pay | Admitting: Gastroenterology

## 2021-12-20 LAB — SURGICAL PATHOLOGY

## 2021-12-22 ENCOUNTER — Encounter: Payer: Self-pay | Admitting: Gastroenterology

## 2022-01-23 ENCOUNTER — Other Ambulatory Visit: Payer: Self-pay | Admitting: Physician Assistant

## 2022-01-23 DIAGNOSIS — R0981 Nasal congestion: Secondary | ICD-10-CM

## 2022-02-02 ENCOUNTER — Other Ambulatory Visit: Payer: Self-pay | Admitting: Physician Assistant

## 2022-02-02 NOTE — Telephone Encounter (Signed)
Requested Prescriptions  ?Pending Prescriptions Disp Refills  ?? propranolol (INDERAL) 10 MG tablet [Pharmacy Med Name: PROPRANOLOL 10 MG TABLET] 30 tablet 1  ?  Sig: TAKE 1 TABLET (10 MG TOTAL) BY MOUTH AS NEEDED (PRN FOR ANXIETY).  ?  ? Cardiovascular:  Beta Blockers Passed - 02/02/2022 12:34 PM  ?  ?  Passed - Last BP in normal range  ?  BP Readings from Last 1 Encounters:  ?12/16/21 123/74  ?   ?  ?  Passed - Last Heart Rate in normal range  ?  Pulse Readings from Last 1 Encounters:  ?12/16/21 62  ?   ?  ?  Passed - Valid encounter within last 6 months  ?  Recent Outpatient Visits   ?      ? 3 months ago Primary hypertension  ? [facility] Alfredia Ferguson, PA-C  ? 1 year ago Annual physical exam  ? [facility] Trey Sailors, [place]  ? 2 years ago Annual physical exam  ? [facility] Osvaldo Angst M, [place]  ? 3 years ago Hx of nausea  ? [facility] [place], [place], [place]  ? 4 years ago Essential hypertension  ? [facility] [place], [place], [place]  ?  ?  ? ?  ?  ?  ? ? ?

## 2022-02-18 ENCOUNTER — Other Ambulatory Visit: Payer: Self-pay | Admitting: Physician Assistant

## 2022-02-18 DIAGNOSIS — R0981 Nasal congestion: Secondary | ICD-10-CM

## 2022-10-14 ENCOUNTER — Ambulatory Visit
Admission: EM | Admit: 2022-10-14 | Discharge: 2022-10-14 | Disposition: A | Discharge status: Home / Self Care | Payer: BC Managed Care – PPO | Attending: Physician Assistant | Admitting: Physician Assistant

## 2022-10-14 DIAGNOSIS — J069 Acute upper respiratory infection, unspecified: Secondary | ICD-10-CM | POA: Diagnosis not present

## 2022-10-14 MED ORDER — PSEUDOEPH-BROMPHEN-DM 30-2-10 MG/5ML PO SYRP: 10.0000 mL | ORAL_SOLUTION | Freq: Four times a day (QID) | ORAL | 0 refills | Status: AC | PRN

## 2022-10-14 MED ORDER — IPRATROPIUM BROMIDE 0.06 % NA SOLN: 2.0000 | Freq: Four times a day (QID) | NASAL | 0 refills | Status: DC

## 2022-10-14 NOTE — Discharge Instructions (Signed)
URI/COLD SYMPTOMS: Your exam today is consistent with a viral illness. Antibiotics are not indicated at this time. Use medications as directed, including cough syrup, nasal saline, and decongestants. Your symptoms should improve over the next few days and resolve within 7-10 days. Increase rest and fluids. F/u if symptoms worsen or predominate such as sore throat, ear pain, productive cough, shortness of breath, or if you develop high fevers or worsening fatigue over the next several days.    

## 2022-10-14 NOTE — ED Provider Notes (Signed)
[facility]    CSN: [id number] Arrival date & time: 10/14/22  0841      History   Chief Complaint Chief Complaint  Patient presents with   Cough    Entered by patient    HPI Jordan Gonzales is a 46 y.o. male presenting for 2-day history of fatigue, sinus pressure, cough, congestion, sore throat.  He says sore throat is mild.  He says initially was very painful but pain has improved.  He denies any fever, chills, shortness of breath, vomiting or diarrhea.  Denies any sick contacts.  Has been taking OTC decongestants without much relief.  Request something to help him get over the symptoms.  He declines respiratory panel as he does not want to do a nasal swab.  HPI  Past Medical History:  Diagnosis Date   Gout    Hypertension     Patient Active Problem List   Diagnosis Date Noted   Special screening for malignant neoplasms, colon    Polyp of sigmoid colon    Primary hypertension 10/27/2021   Gout 10/27/2021   Situational anxiety 10/27/2021   Nasal congestion 10/27/2021   HLD (hyperlipidemia) 03/09/2017   History of gout 03/09/2017    Past Surgical History:  Procedure Laterality Date   COLONOSCOPY N/A 12/16/2021   Procedure: COLONOSCOPY;  Surgeon: Lucilla Lame, MD;  Location: [place];  Service: Endoscopy;  Laterality: N/A;   EYE SURGERY     MENISCUS REPAIR         Home Medications    Prior to Admission medications   Medication Sig Start Date End Date Taking? Authorizing Provider  allopurinol (ZYLOPRIM) 300 MG tablet Take 1 tablet (300 mg total) by mouth daily. 10/27/21  Yes Mikey Kirschner, PA-C  amLODipine (NORVASC) 10 MG tablet Take 1 tablet (10 mg total) by mouth daily. 10/27/21  Yes Drubel, Ria Comment, PA-C  Azelastine HCl 137 MCG/SPRAY SOLN PLACE 1 SPRAY INTO BOTH NOSTRILS 2 (TWO) TIMES DAILY. USE IN EACH NOSTRIL AS DIRECTED 02/20/22  Yes Drubel, Ria Comment, PA-C  brompheniramine-pseudoephedrine-DM 30-2-10 MG/5ML syrup Take 10 mLs  by mouth 4 (four) times daily as needed for up to 7 days. 10/14/22 10/21/22 Yes Laurene Footman B, PA-C  ipratropium (ATROVENT) 0.06 % nasal spray Place 2 sprays into both nostrils 4 (four) times daily. 10/14/22  Yes Laurene Footman B, PA-C  propranolol (INDERAL) 10 MG tablet TAKE 1 TABLET (10 MG TOTAL) BY MOUTH AS NEEDED (PRN FOR ANXIETY). 02/02/22  Yes Mikey Kirschner, PA-C    Family History Family History  Problem Relation Age of Onset   Prostate cancer Father     Social History Social History   Tobacco Use   Smoking status: Never   Smokeless tobacco: Never  Vaping Use   Vaping Use: Never used  Substance Use Topics   Alcohol use: Yes    Comment: on the weekends sometimes   Drug use: No     Allergies   Patient has no known allergies.   Review of Systems Review of Systems  Constitutional:  Positive for fatigue. Negative for fever.  HENT:  Positive for congestion, rhinorrhea, sinus pressure and sore throat. Negative for sinus pain.   Respiratory:  Positive for cough. Negative for shortness of breath.   Gastrointestinal:  Negative for abdominal pain, diarrhea, nausea and vomiting.  Musculoskeletal:  Negative for myalgias.  Neurological:  Negative for weakness, light-headedness and headaches.  Hematological:  Negative for adenopathy.     Physical Exam Triage Vital Signs ED  Triage Vitals  Enc Vitals Group     BP      Pulse      Resp      Temp      Temp src      SpO2      Weight      Height      Head Circumference      Peak Flow      Pain Score      Pain Loc      Pain Edu?      Excl. in [place]?    No data found.  Updated Vital Signs BP (!) 153/94 (BP Location: Left Arm)   Pulse 90   Temp 99.6 F (37.6 C) (Oral)   Resp 18   Ht 5\' 9"  (1.753 m)   Wt 210 lb (95.3 kg)   SpO2 95%   BMI 31.01 kg/m      Physical Exam Vitals and nursing note reviewed.  Constitutional:      General: He is not in acute distress.    Appearance: Normal appearance. He is  well-developed. He is not ill-appearing.  HENT:     Head: Normocephalic and atraumatic.     Right Ear: Tympanic membrane, ear canal and external ear normal.     Left Ear: Tympanic membrane, ear canal and external ear normal.     Nose: Congestion present.     Mouth/Throat:     Mouth: Mucous membranes are moist.     Pharynx: Oropharynx is clear. Posterior oropharyngeal erythema (mild with PND) present.  Eyes:     General: No scleral icterus.    Conjunctiva/sclera: Conjunctivae normal.  Cardiovascular:     Rate and Rhythm: Normal rate and regular rhythm.     Heart sounds: Normal heart sounds.  Pulmonary:     Effort: Pulmonary effort is normal. No respiratory distress.     Breath sounds: Normal breath sounds.  Musculoskeletal:     Cervical back: Neck supple.  Skin:    General: Skin is warm and dry.     Capillary Refill: Capillary refill takes less than 2 seconds.  Neurological:     General: No focal deficit present.     Mental Status: He is alert. Mental status is at baseline.     Motor: No weakness.     Gait: Gait normal.  Psychiatric:        Mood and Affect: Mood normal.        Behavior: Behavior normal.      UC Treatments / Results  Labs (all labs ordered are listed, but only abnormal results are displayed) Labs Reviewed - No data to display  EKG   Radiology No results found.  Procedures Procedures (including critical care time)  Medications Ordered in UC Medications - No data to display  Initial Impression / Assessment and Plan / UC Course  I have reviewed the triage vital signs and the nursing notes.  Pertinent labs & imaging results that were available during my care of the patient were reviewed by me and considered in my medical decision making (see chart for details).   46 year old male presents for 2-day history of fatigue, sinus pressure, cough, congestion and sore throat.  No known fever.  No report of breathing difficulty/wheezing, vomiting or  diarrhea.  He is afebrile and overall well-appearing.  On exam he does have nasal congestion and mild posterior frontal erythema with clear postnasal drainage.  Chest clear auscultation heart regular rate and rhythm.  Patient declines  respiratory panel.  Suspect viral illness.  Could be COVID-19 or influenza.  Advised him to wear his mask to cover cough.  Advised supportive care.  Sent Bromfed-DM and Atrovent nasal spray.  Plenty rest and fluids.  Reviewed that he may feel ill for the next 7 to 10 days but he should be improving.  Reviewed return precautions.   Final Clinical Impressions(s) / UC Diagnoses   Final diagnoses:  Viral URI with cough     Discharge Instructions      URI/COLD SYMPTOMS: Your exam today is consistent with a viral illness. Antibiotics are not indicated at this time. Use medications as directed, including cough syrup, nasal saline, and decongestants. Your symptoms should improve over the next few days and resolve within 7-10 days. Increase rest and fluids. F/u if symptoms worsen or predominate such as sore throat, ear pain, productive cough, shortness of breath, or if you develop high fevers or worsening fatigue over the next several days.       ED Prescriptions     Medication Sig Dispense Auth. Provider   brompheniramine-pseudoephedrine-DM 30-2-10 MG/5ML syrup Take 10 mLs by mouth 4 (four) times daily as needed for up to 7 days. 150 mL Eusebio Friendly B, PA-C   ipratropium (ATROVENT) 0.06 % nasal spray Place 2 sprays into both nostrils 4 (four) times daily. 15 mL Shirlee Latch, PA-C      PDMP not reviewed this encounter.   Shirlee Latch, PA-C 10/14/22 1008

## 2022-10-14 NOTE — ED Triage Notes (Signed)
Pt c/o productive cough, headache, facial pressure, and sore throat x3days  Pt declines covid and flu testing.

## 2022-10-25 ENCOUNTER — Other Ambulatory Visit: Payer: Self-pay | Admitting: Physician Assistant

## 2022-10-25 DIAGNOSIS — I10 Essential (primary) hypertension: Secondary | ICD-10-CM

## 2022-10-25 DIAGNOSIS — Z8739 Personal history of other diseases of the musculoskeletal system and connective tissue: Secondary | ICD-10-CM

## 2023-01-21 ENCOUNTER — Other Ambulatory Visit: Payer: Self-pay | Admitting: Physician Assistant

## 2023-01-21 DIAGNOSIS — I10 Essential (primary) hypertension: Secondary | ICD-10-CM

## 2023-01-21 DIAGNOSIS — Z8739 Personal history of other diseases of the musculoskeletal system and connective tissue: Secondary | ICD-10-CM

## 2023-02-13 ENCOUNTER — Other Ambulatory Visit: Payer: Self-pay | Admitting: Physician Assistant

## 2023-02-13 DIAGNOSIS — Z8739 Personal history of other diseases of the musculoskeletal system and connective tissue: Secondary | ICD-10-CM

## 2023-02-13 DIAGNOSIS — I10 Essential (primary) hypertension: Secondary | ICD-10-CM

## 2023-02-17 ENCOUNTER — Other Ambulatory Visit: Payer: Self-pay | Admitting: Physician Assistant

## 2023-02-17 DIAGNOSIS — I10 Essential (primary) hypertension: Secondary | ICD-10-CM

## 2023-02-17 DIAGNOSIS — Z8739 Personal history of other diseases of the musculoskeletal system and connective tissue: Secondary | ICD-10-CM

## 2023-02-19 NOTE — Telephone Encounter (Signed)
Requested medication (s) are due for refill today: yes  Requested medication (s) are on the active medication list: yes  Last refill:  01/22/23,30 days  Future visit scheduled: yes, 02/26/23  Notes to clinic:  Unable to refill per protocol, courtesy refill already given, routing for provider approval.      Requested Prescriptions  Pending Prescriptions Disp Refills   amLODipine (NORVASC) 10 MG tablet [Pharmacy Med Name: AMLODIPINE BESYLATE 10 MG TAB] 30 tablet 0    Sig: TAKE 1 TABLET BY MOUTH EVERY DAY     Cardiovascular: Calcium Channel Blockers 2 Failed - 02/17/2023  8:55 AM      Failed - Last BP in normal range    BP Readings from Last 1 Encounters:  10/14/22 (!) 153/94         Failed - Valid encounter within last 6 months    Recent Outpatient Visits           1 year ago Primary hypertension   [place] Mikey Kirschner, PA-C   2 years ago Annual physical exam   [facility] Carles Collet M, [place]   3 years ago Annual physical exam   [facility] Carles Collet M, [place]   4 years ago Hx of nausea   [place], [place]   5 years ago Essential hypertension   [place], [place]       Future Appointments             In 1 week Thedore Mins, Ria Comment, PA-C [place]            Passed - Last Heart Rate in normal range    Pulse Readings from Last 1 Encounters:  10/14/22 90          allopurinol (ZYLOPRIM) 300 MG tablet [Pharmacy Med Name: ALLOPURINOL 300 MG TABLET] 30 tablet 0    Sig: TAKE 1 [place] DAY     Endocrinology:  Gout Agents - allopurinol Failed - 02/17/2023  8:55 AM      Failed - Uric Acid in normal range and within 360 days    Uric Acid  Date Value Ref Range Status  10/27/2021 5.3 3.8 - 8.4 mg/dL Final    Comment:               Therapeutic target for  gout patients: <6.0         Failed - Cr in normal range and within 360 days    Creatinine, Ser  Date Value Ref Range Status  10/27/2021 0.97 0.76 - 1.27 mg/dL Final         Failed - Valid encounter within last 12 months    Recent Outpatient Visits           1 year ago Primary hypertension   [place] Mikey Kirschner, PA-C   2 years ago Annual physical exam   [facility] Trinna Post, [place]   3 years ago Annual physical exam   [facility] [place], Fabio Bering M, [place]   4 years ago Hx of nausea   [place], [place], [place]   5 years ago Essential hypertension   [place], [place]       Future Appointments  In 1 week Drubel, Ria Comment, PA-C [facility], [facility]            Failed - CBC within normal limits and completed in the last 12 months    WBC  Date Value Ref Range Status  10/07/2020 4.8 3.4 - 10.8 x10E3/uL Final   RBC  Date Value Ref Range Status  10/07/2020 4.78 4.14 - 5.80 x10E6/uL Final   Hemoglobin  Date Value Ref Range Status  10/07/2020 15.8 13.0 - 17.7 g/dL Final   Hematocrit  Date Value Ref Range Status  10/07/2020 45.6 37.5 - 51.0 % Final   MCHC  Date Value Ref Range Status  10/07/2020 34.6 31.5 - 35.7 g/dL Final   [facility]  Date Value Ref Range Status  10/07/2020 33.1 (H) 26.6 - 33.0 pg Final   MCV  Date Value Ref Range Status  10/07/2020 95 79 - 97 fL Final   No results found for: "PLTCOUNTKUC", "LABPLAT", "POCPLA" RDW  Date Value Ref Range Status  10/07/2020 12.9 11.6 - 15.4 % Final

## 2023-02-26 ENCOUNTER — Ambulatory Visit: Payer: BC Managed Care – PPO | Admitting: Physician Assistant

## 2023-02-26 ENCOUNTER — Encounter: Payer: Self-pay | Admitting: Physician Assistant

## 2023-02-26 VITALS — BP 153/99 | HR 84 | Temp 98.3°F | Resp 16 | Ht 69.0 in | Wt 211.9 lb

## 2023-02-26 DIAGNOSIS — I10 Essential (primary) hypertension: Secondary | ICD-10-CM

## 2023-02-26 DIAGNOSIS — Z8739 Personal history of other diseases of the musculoskeletal system and connective tissue: Secondary | ICD-10-CM

## 2023-02-26 DIAGNOSIS — F418 Other specified anxiety disorders: Secondary | ICD-10-CM

## 2023-02-26 DIAGNOSIS — E782 Mixed hyperlipidemia: Secondary | ICD-10-CM | POA: Diagnosis not present

## 2023-02-26 DIAGNOSIS — L608 Other nail disorders: Secondary | ICD-10-CM

## 2023-02-26 DIAGNOSIS — R739 Hyperglycemia, unspecified: Secondary | ICD-10-CM

## 2023-02-26 MED ORDER — ALLOPURINOL 300 MG PO TABS: 300.0000 mg | ORAL_TABLET | Freq: Every day | ORAL | 1 refills | Status: DC

## 2023-02-26 MED ORDER — AMLODIPINE BESYLATE 10 MG PO TABS: 10.0000 mg | ORAL_TABLET | Freq: Every day | ORAL | 1 refills | Status: DC

## 2023-02-26 NOTE — Assessment & Plan Note (Addendum)
Managed with amlodipine 10 mg  Elevated in office. Pt does not check. Last visit here was 12/22.  Advised strongly to check pressure at home, that his significant health anxiety is likely falsely raising her BP here Pt agreeable but does not want to come in frequently. If BP elevated at home pt should call office F.u 6 mo

## 2023-02-26 NOTE — Assessment & Plan Note (Signed)
Certain situations, medical setting is a large trigger.  Okay with writing for extra time on his upcoming exam

## 2023-02-26 NOTE — Progress Notes (Signed)
Jordan Gonzales,acting as a scribe for [employer], PA-C.,have documented all relevant documentation on the behalf of Jordan Ferguson, PA-C,as directed by  Jordan Ferguson, PA-C while in the presence of Jordan Ferguson, PA-C.   Established patient visit   Patient: Jordan Gonzales   DOB: July 19, 1976   47 y.o. Male  MRN: [id number] Visit Date: 02/26/2023  Today's healthcare provider: Alfredia Ferguson, PA-C   Chief Complaint  Patient presents with   Medication Refill   Subjective    HPI   Needs a note for work for extra time on exam 2/2 to his anxiety.  Right toenail great toe discolored x 1 year denies pain, itching. He is very disturbed by this and dislikes the look of it.  Hypertension, follow-up  BP Readings from Last 3 Encounters:  02/26/23 (!) 153/99  10/14/22 (!) 153/94  12/16/21 123/74   Wt Readings from Last 3 Encounters:  02/26/23 211 lb 14.4 oz (96.1 kg)  10/14/22 210 lb (95.3 kg)  12/16/21 209 lb (94.8 kg)     Outside blood pressures are not being checked  Symptoms: No chest pain No chest pressure  No palpitations No syncope  No dyspnea No orthopnea  No paroxysmal nocturnal dyspnea No lower extremity edema   Pertinent labs Lab Results  Component Value Date   CHOL 204 (H) 10/27/2021   HDL 42 10/27/2021   LDLCALC 135 (H) 10/27/2021   TRIG 151 (H) 10/27/2021   CHOLHDL 4.9 10/27/2021   Lab Results  Component Value Date   NA 140 10/27/2021   K 4.6 10/27/2021   CREATININE 0.97 10/27/2021   EGFR 98 10/27/2021   GLUCOSE 118 (H) 10/27/2021   TSH 0.879 10/07/2020     The 10-year ASCVD risk score (Arnett DK, et al., 2019) is: 5.6%  ---------------------------------------------------------------------------------------------------  Medications: Outpatient Medications Prior to Visit  Medication Sig   Azelastine HCl 137 MCG/SPRAY SOLN PLACE 1 SPRAY INTO BOTH NOSTRILS 2 (TWO) TIMES DAILY. USE IN EACH NOSTRIL AS DIRECTED   ipratropium  (ATROVENT) 0.06 % nasal spray Place 2 sprays into both nostrils 4 (four) times daily.   [DISCONTINUED] allopurinol (ZYLOPRIM) 300 MG tablet TAKE 1 TABLET BY MOUTH EVERY DAY   [DISCONTINUED] amLODipine (NORVASC) 10 MG tablet TAKE 1 TABLET BY MOUTH EVERY DAY   [DISCONTINUED] propranolol (INDERAL) 10 MG tablet TAKE 1 TABLET (10 MG TOTAL) BY MOUTH AS NEEDED (PRN FOR ANXIETY).   No facility-administered medications prior to visit.    Review of Systems  Constitutional:  Negative for fatigue and fever.  Respiratory:  Negative for cough and shortness of breath.   Cardiovascular:  Negative for chest pain, palpitations and leg swelling.  Neurological:  Negative for dizziness and headaches.  Psychiatric/Behavioral:  The patient is nervous/anxious.       Objective    BP (!) 153/99 (BP Location: Left Arm, Patient Position: Sitting, Cuff Size: Large)   Pulse 84   Temp 98.3 F (36.8 C)   Resp 16   Ht 5\' 9"  (1.753 m)   Wt 211 lb 14.4 oz (96.1 kg)   SpO2 99%   BMI 31.29 kg/m   Physical Exam Constitutional:      General: He is awake.     Appearance: He is well-developed.  HENT:     Head: Normocephalic.  Eyes:     Conjunctiva/sclera: Conjunctivae normal.  Cardiovascular:     Rate and Rhythm: Normal rate and regular rhythm.     Heart sounds: Normal heart sounds.  Pulmonary:     Effort: Pulmonary effort is normal.     Breath sounds: Normal breath sounds.  Feet:     Comments: R great toe with some light grey/brown discoloration along the medial edge. No erythema, nothing appears ingrown. Discoloration does appear to be on nail and not skin. No other signs of fungal disease Skin:    General: Skin is warm.  Neurological:     Mental Status: He is alert and oriented to person, place, and time.  Psychiatric:        Attention and Perception: Attention normal.        Mood and Affect: Mood normal.        Speech: Speech normal.        Behavior: Behavior is cooperative.     No results found  for any visits on 02/26/23.  Assessment & Plan      Problem List Items Addressed This Visit       Cardiovascular and Mediastinum   Primary hypertension - Primary    Managed with amlodipine 10 mg  Elevated in office. Pt does not check. Last visit here was 12/22.  Advised strongly to check pressure at home, that his significant health anxiety is likely falsely raising her BP here Pt agreeable but does not want to come in frequently. If BP elevated at home pt should call office F.u 6 mo       Relevant Medications   amLODipine (NORVASC) 10 MG tablet   Other Relevant Orders   CBC w/Diff/Platelet   Comprehensive Metabolic Panel (CMET)     Musculoskeletal and Integument   Toenail deformity     Other   HLD (hyperlipidemia)    Unmedicated. Will check fasting lipids The 10-year ASCVD risk score (Arnett DK, et al., 2019) is: 5.6%       Relevant Medications   amLODipine (NORVASC) 10 MG tablet   Other Relevant Orders   CBC w/Diff/Platelet   Comprehensive Metabolic Panel (CMET)   Lipid Profile   History of gout    Denies any recent flares Will check uric acid level Refilled allopurinol 300 mg, will check cmp      Relevant Medications   allopurinol (ZYLOPRIM) 300 MG tablet   Other Relevant Orders   CBC w/Diff/Platelet   Uric acid   Situational anxiety    Certain situations, medical setting is a large trigger.  Okay with writing for extra time on his upcoming exam      Other Visit Diagnoses     Hyperglycemia       Relevant Orders   CBC w/Diff/Platelet   HgB A1c      Toenail discoloration -does not appear to be in the skin, unsure if larger or spreading  -referring to podiatry in case of fungal disease vs malignancy? Likely 2/2 to hitting toe on something  Return in about 6 months (around 08/28/2023) for hypertension.      I, Jordan Ferguson, PA-C have reviewed all documentation for this visit. The documentation on  02/26/23  for the exam, diagnosis, procedures, and  orders are all accurate and complete.  Jordan Ferguson, PA-C [facility] [address] [place], [place], [address] Office: [phone] Fax: [phone]   [facility]

## 2023-02-26 NOTE — Assessment & Plan Note (Signed)
Unmedicated. Will check fasting lipids The 10-year ASCVD risk score (Arnett DK, et al., 2019) is: 5.6%

## 2023-02-26 NOTE — Progress Notes (Signed)
Description of disability and limitations to testing  Name Recommended accomodation ---extra time; hour and a half extra Name title and telephone of me  Signature

## 2023-02-26 NOTE — Assessment & Plan Note (Signed)
Denies any recent flares Will check uric acid level Refilled allopurinol 300 mg, will check cmp

## 2023-02-27 ENCOUNTER — Encounter: Payer: Self-pay | Admitting: Physician Assistant

## 2023-02-27 LAB — COMPREHENSIVE METABOLIC PANEL
ALT: 51 IU/L — ABNORMAL HIGH (ref 0–44)
AST: 29 IU/L (ref 0–40)
Albumin/Globulin Ratio: 1.7 (ref 1.2–2.2)
Albumin: 4.8 g/dL (ref 4.1–5.1)
Alkaline Phosphatase: 60 IU/L (ref 44–121)
BUN/Creatinine Ratio: 16 (ref 9–20)
BUN: 15 mg/dL (ref 6–24)
Bilirubin Total: 1.7 mg/dL — ABNORMAL HIGH (ref 0.0–1.2)
CO2: 24 mmol/L (ref 20–29)
Calcium: 9.9 mg/dL (ref 8.7–10.2)
Chloride: 102 mmol/L (ref 96–106)
Creatinine, Ser: 0.96 mg/dL (ref 0.76–1.27)
Globulin, Total: 2.9 g/dL (ref 1.5–4.5)
Glucose: 127 mg/dL — ABNORMAL HIGH (ref 70–99)
Potassium: 4.9 mmol/L (ref 3.5–5.2)
Sodium: 141 mmol/L (ref 134–144)
Total Protein: 7.7 g/dL (ref 6.0–8.5)
eGFR: 98 mL/min/{1.73_m2} (ref >59)

## 2023-02-27 LAB — CBC WITH DIFFERENTIAL/PLATELET
Basophils Absolute: 0 10*3/uL (ref 0.0–0.2)
Basos: 1 %
EOS (ABSOLUTE): 0.1 10*3/uL (ref 0.0–0.4)
Eos: 3 %
Hematocrit: 45.6 % (ref 37.5–51.0)
Hemoglobin: 15.8 g/dL (ref 13.0–17.7)
Immature Grans (Abs): 0 10*3/uL (ref 0.0–0.1)
Immature Granulocytes: 0 %
Lymphocytes Absolute: 1.5 10*3/uL (ref 0.7–3.1)
Lymphs: 31 %
MCH: 33 pg (ref 26.6–33.0)
MCHC: 34.6 g/dL (ref 31.5–35.7)
MCV: 95 fL (ref 79–97)
Monocytes Absolute: 0.6 10*3/uL (ref 0.1–0.9)
Monocytes: 13 %
Neutrophils Absolute: 2.7 10*3/uL (ref 1.4–7.0)
Neutrophils: 52 %
Platelets: 247 10*3/uL (ref 150–450)
RBC: 4.79 x10E6/uL (ref 4.14–5.80)
RDW: 13.7 % (ref 11.6–15.4)
WBC: 5 10*3/uL (ref 3.4–10.8)

## 2023-02-27 LAB — HEMOGLOBIN A1C
Est. average glucose Bld gHb Est-mCnc: 120 mg/dL
Hgb A1c MFr Bld: 5.8 % — ABNORMAL HIGH (ref 4.8–5.6)

## 2023-02-27 LAB — URIC ACID: Uric Acid: 5.7 mg/dL (ref 3.8–8.4)

## 2023-02-27 LAB — LIPID PANEL
Chol/HDL Ratio: 5 ratio (ref 0.0–5.0)
Cholesterol, Total: 241 mg/dL — ABNORMAL HIGH (ref 100–199)
HDL: 48 mg/dL (ref >39)
LDL Chol Calc (NIH): 156 mg/dL — ABNORMAL HIGH (ref 0–99)
Triglycerides: 202 mg/dL — ABNORMAL HIGH (ref 0–149)
VLDL Cholesterol Cal: 37 mg/dL (ref 5–40)

## 2023-08-22 ENCOUNTER — Other Ambulatory Visit: Payer: Self-pay | Admitting: Physician Assistant

## 2023-08-22 DIAGNOSIS — I10 Essential (primary) hypertension: Secondary | ICD-10-CM

## 2023-08-22 DIAGNOSIS — Z8739 Personal history of other diseases of the musculoskeletal system and connective tissue: Secondary | ICD-10-CM

## 2023-08-28 ENCOUNTER — Ambulatory Visit: Payer: BC Managed Care – PPO | Admitting: Family Medicine

## 2023-08-29 ENCOUNTER — Ambulatory Visit: Payer: BC Managed Care – PPO | Admitting: Physician Assistant

## 2023-08-29 VITALS — BP 140/83 | HR 68 | Ht 69.0 in | Wt 217.2 lb

## 2023-08-29 DIAGNOSIS — E782 Mixed hyperlipidemia: Secondary | ICD-10-CM

## 2023-08-29 DIAGNOSIS — F418 Other specified anxiety disorders: Secondary | ICD-10-CM | POA: Diagnosis not present

## 2023-08-29 DIAGNOSIS — Z23 Encounter for immunization: Secondary | ICD-10-CM

## 2023-08-29 DIAGNOSIS — L608 Other nail disorders: Secondary | ICD-10-CM

## 2023-08-29 DIAGNOSIS — I10 Essential (primary) hypertension: Secondary | ICD-10-CM | POA: Diagnosis not present

## 2023-08-29 DIAGNOSIS — R739 Hyperglycemia, unspecified: Secondary | ICD-10-CM

## 2023-08-29 NOTE — Progress Notes (Signed)
Established patient visit  Patient: Jordan Gonzales   DOB: 04-Jun-1976   47 y.o. Male  MRN: [id number] Visit Date: 08/29/2023  Today's healthcare provider: Debera Lat, PA-C   Chief Complaint  Patient presents with   Medical Management of Chronic Issues    HTN follow-up   Subjective     Discussed the use of AI scribe software for clinical note transcription with the patient, who gave verbal consent to proceed.  History of Present Illness   The patient, with a history of hypertension, anxiety, and gout, presents for a routine follow-up. He recently started amlodipine for hypertension. He reports occasional home blood pressure monitoring, with systolic readings in the 130s to 140s and lower diastolic readings. However, he admits to infrequent monitoring due to anxiety, which also contributes to elevated readings in the clinic setting.  The patient's gout is managed with allopurinol and he denies any recent flares. He reports a stable weight and no changes in his anxiety level. He has a persistent toenail issue, which has not improved, but he has not sought podiatric care due to time constraints.           02/26/2023    8:23 AM 10/27/2021    9:03 AM 10/07/2020    9:49 AM  Depression screen PHQ 2/9  Decreased Interest 0 0 0  Down, Depressed, Hopeless 0 0 0  PHQ - 2 Score 0 0 0  Altered sleeping 0 0 0  Tired, decreased energy 0 0 0  Change in appetite 0 0 0  Feeling bad or failure about yourself  0 0 0  Trouble concentrating 0 0 0  Moving slowly or fidgety/restless  0 0  Suicidal thoughts 0 0 0  PHQ-9 Score 0 0 0  Difficult doing work/chores Not difficult at all Not difficult at all Not difficult at all       No data to display          Medications: Outpatient Medications Prior to Visit  Medication Sig   allopurinol (ZYLOPRIM) 300 MG tablet TAKE 1 TABLET BY MOUTH EVERY DAY   amLODipine (NORVASC) 10 MG tablet TAKE 1 TABLET BY MOUTH EVERY DAY    Azelastine HCl 137 MCG/SPRAY SOLN PLACE 1 SPRAY INTO BOTH NOSTRILS 2 (TWO) TIMES DAILY. USE IN EACH NOSTRIL AS DIRECTED   ipratropium (ATROVENT) 0.06 % nasal spray Place 2 sprays into both nostrils 4 (four) times daily.   No facility-administered medications prior to visit.    Review of Systems  All other systems reviewed and are negative.  Except see HPI       Objective    BP (!) 140/83 (BP Location: Right Arm, Patient Position: Sitting, Cuff Size: Large)   Pulse 68   Ht 5\' 9"  (1.753 m)   Wt 217 lb 3.2 oz (98.5 kg)   SpO2 100%   BMI 32.07 kg/m     Physical Exam Vitals reviewed.  Constitutional:      General: He is not in acute distress.    Appearance: Normal appearance. He is not diaphoretic.  HENT:     Head: Normocephalic and atraumatic.  Eyes:     General: No scleral icterus.    Conjunctiva/sclera: Conjunctivae normal.  Cardiovascular:     Rate and Rhythm: Normal rate and regular rhythm.     Pulses: Normal pulses.     Heart sounds: Normal heart sounds. No murmur heard. Pulmonary:     Effort: Pulmonary effort is normal. No respiratory distress.  Breath sounds: Normal breath sounds. No wheezing or rhonchi.  Musculoskeletal:     Cervical back: Neck supple.     Right lower leg: No edema.     Left lower leg: No edema.  Lymphadenopathy:     Cervical: No cervical adenopathy.  Skin:    General: Skin is warm and dry.     Findings: No rash.  Neurological:     Mental Status: He is alert and oriented to person, place, and time. Mental status is at baseline.  Psychiatric:        Mood and Affect: Mood normal.        Behavior: Behavior normal.      No results found for any visits on 08/29/23.  Assessment & Plan      Hypertension Patient reports occasional home blood pressure readings in the 130s-140s systolic, but expresses anxiety about checking blood pressure. Currently on Amlodipine 10mg  daily. -Encourage daily home blood pressure monitoring for two weeks  by a third party to avoid anxiety-induced elevations. -Continue Amlodipine 10mg  daily. -Return to clinic if home readings consistently >140/90.  Hyperuricemia On Allopurinol 300mg  daily, last uric acid level was 5.7 (within therapeutic range). -Continue Allopurinol 300mg  daily.  Dyslipidemia Slightly elevated cholesterol, but not requiring medication. Will check his LP again considering his elevated BP on his current BP med -Continue lifestyle modifications.  Prediabetes No recent blood work. -Order blood work to assess for prediabetes.  Toenail issue No improvement, has not seen podiatry. -Recommend consultation with podiatry.  Anxiety Reports good control. -Continue current management.  Follow-up Schedule appointment in 1 month if blood pressure readings are consistently high, otherwise follow-up in 6 months.       Return in about 6 months (around 02/27/2024) for chronic disease f/u.    The patient was advised to call back or seek an in-person evaluation if the symptoms worsen or if the condition fails to improve as anticipated.  I discussed the assessment and treatment plan with the patient. The patient was provided an opportunity to ask questions and all were answered. The patient agreed with the plan and demonstrated an understanding of the instructions.  I, Debera Lat, PA-C have reviewed all documentation for this visit. The documentation on  08/28/23 for the exam, diagnosis, procedures, and orders are all accurate and complete.  Debera Lat, [facility], MMS [facility] [phone] (phone) [phone] (fax)   [facility]

## 2023-08-30 LAB — CBC WITH DIFFERENTIAL/PLATELET
Basophils Absolute: 0 10*3/uL (ref 0.0–0.2)
Basos: 1 %
EOS (ABSOLUTE): 0.2 10*3/uL (ref 0.0–0.4)
Eos: 4 %
Hematocrit: 46.4 % (ref 37.5–51.0)
Hemoglobin: 15.6 g/dL (ref 13.0–17.7)
Immature Grans (Abs): 0 10*3/uL (ref 0.0–0.1)
Immature Granulocytes: 0 %
Lymphocytes Absolute: 1.6 10*3/uL (ref 0.7–3.1)
Lymphs: 34 %
MCH: 33.9 pg — ABNORMAL HIGH (ref 26.6–33.0)
MCHC: 33.6 g/dL (ref 31.5–35.7)
MCV: 101 fL — ABNORMAL HIGH (ref 79–97)
Monocytes Absolute: 0.6 10*3/uL (ref 0.1–0.9)
Monocytes: 12 %
Neutrophils Absolute: 2.3 10*3/uL (ref 1.4–7.0)
Neutrophils: 49 %
Platelets: 263 10*3/uL (ref 150–450)
RBC: 4.6 x10E6/uL (ref 4.14–5.80)
RDW: 13.3 % (ref 11.6–15.4)
WBC: 4.8 10*3/uL (ref 3.4–10.8)

## 2023-08-30 LAB — HEMOGLOBIN A1C
Est. average glucose Bld gHb Est-mCnc: 120 mg/dL
Hgb A1c MFr Bld: 5.8 % — ABNORMAL HIGH (ref 4.8–5.6)

## 2023-08-30 LAB — COMPREHENSIVE METABOLIC PANEL
ALT: 22 [IU]/L (ref 0–44)
AST: 21 [IU]/L (ref 0–40)
Albumin: 4.5 g/dL (ref 4.1–5.1)
Alkaline Phosphatase: 56 [IU]/L (ref 44–121)
BUN/Creatinine Ratio: 13 (ref 9–20)
BUN: 13 mg/dL (ref 6–24)
Bilirubin Total: 1.4 mg/dL — ABNORMAL HIGH (ref 0.0–1.2)
CO2: 25 mmol/L (ref 20–29)
Calcium: 9.9 mg/dL (ref 8.7–10.2)
Chloride: 103 mmol/L (ref 96–106)
Creatinine, Ser: 1.03 mg/dL (ref 0.76–1.27)
Globulin, Total: 2.7 g/dL (ref 1.5–4.5)
Glucose: 114 mg/dL — ABNORMAL HIGH (ref 70–99)
Potassium: 4.9 mmol/L (ref 3.5–5.2)
Sodium: 142 mmol/L (ref 134–144)
Total Protein: 7.2 g/dL (ref 6.0–8.5)
eGFR: 90 mL/min/{1.73_m2} (ref >59)

## 2023-08-30 LAB — TSH: TSH: 0.55 u[IU]/mL (ref 0.450–4.500)

## 2023-08-30 LAB — LIPID PANEL
Chol/HDL Ratio: 4.4 {ratio} (ref 0.0–5.0)
Cholesterol, Total: 191 mg/dL (ref 100–199)
HDL: 43 mg/dL (ref >39)
LDL Chol Calc (NIH): 123 mg/dL — ABNORMAL HIGH (ref 0–99)
Triglycerides: 138 mg/dL (ref 0–149)
VLDL Cholesterol Cal: 25 mg/dL (ref 5–40)

## 2023-09-28 ENCOUNTER — Ambulatory Visit: Payer: Self-pay

## 2023-09-28 NOTE — Telephone Encounter (Signed)
Noted  

## 2023-09-28 NOTE — Telephone Encounter (Signed)
  Chief Complaint: High bp readings- occasional HA Symptoms: occasional HA Frequency: ongoing Pertinent Negatives: Patient denies chest pain, SOB, Neurological issues Disposition: [] ED /[] Urgent Care (no appt availability in office) / [x] Appointment(In office/virtual)/ []  [place] [facility]/ [] Home Care/ [] Refused Recommended Disposition /[] [facility]/ []  Follow-up with PCP Additional Notes: Pt called after having 2 high BP readings at work physical today.  Pt has hx of HTN and is on amlodipine. Pt does have occasional HA. Pt will monitor s/s and seek immediate help if needed. Pt will keep a log to bring to appt scheduled on Thursday. Pt does not feel this is emergent, and feels that appt next week is fine.   Reason for Disposition  Systolic BP  >= 180 OR Diastolic >= 110  Answer Assessment - Initial Assessment Questions 1. BLOOD PRESSURE: "What is the blood pressure?" "Did you take at least two measurements 5 minutes apart?"     180/100  2. ONSET: "When did you take your blood pressure?"     today 3. HOW: "How did you take your blood pressure?" (e.g., automatic home BP monitor, visiting nurse)     At  4. HISTORY: "Do you have a history of high blood pressure?"     yes 5. MEDICINES: "Are you taking any medicines for blood pressure?" "Have you missed any doses recently?"     Amlodipine 6. OTHER SYMPTOMS: "Do you have any symptoms?" (e.g., blurred vision, chest pain, difficulty breathing, headache, weakness)     No  Protocols used: Blood Pressure - High-A-AH

## 2023-10-04 ENCOUNTER — Ambulatory Visit: Payer: BC Managed Care – PPO | Admitting: Family Medicine

## 2023-10-04 VITALS — BP 144/86 | HR 80 | Ht 69.0 in | Wt 212.0 lb

## 2023-10-04 DIAGNOSIS — R7303 Prediabetes: Secondary | ICD-10-CM | POA: Insufficient documentation

## 2023-10-04 DIAGNOSIS — I10 Essential (primary) hypertension: Secondary | ICD-10-CM

## 2023-10-04 DIAGNOSIS — F418 Other specified anxiety disorders: Secondary | ICD-10-CM

## 2023-10-04 DIAGNOSIS — E7800 Pure hypercholesterolemia, unspecified: Secondary | ICD-10-CM | POA: Insufficient documentation

## 2023-10-04 MED ORDER — LISINOPRIL-HYDROCHLOROTHIAZIDE 20-12.5 MG PO TABS: 1.0000 | ORAL_TABLET | Freq: Every day | ORAL | 3 refills | Status: DC

## 2023-10-04 MED ORDER — ROSUVASTATIN CALCIUM 10 MG PO TABS: 10.0000 mg | ORAL_TABLET | Freq: Every day | ORAL | 3 refills | Status: DC

## 2023-10-04 NOTE — Progress Notes (Signed)
Established patient visit   Patient: Jordan Gonzales   DOB: 03-Jul-1976   47 y.o. Male  MRN: [id number] Visit Date: 10/04/2023  Today's healthcare provider: Jacky Kindle, FNP  Introduced to nurse practitioner role and practice setting.  All questions answered.  Discussed provider/patient relationship and expectations.  Chief Complaint  Patient presents with   Follow-up   Subjective    HPI   Jordan Gonzales, a patient with a known history of hypertension and prediabetes, presents with concerns about his persistently high blood pressure. Despite regular exercise and attempts at dietary modifications, he has not been able to achieve his target blood pressure of 120/60. He reports no symptoms of high blood pressure such as headaches, blurry vision, or swelling. However, he does experience anxiety and palpitations during medical appointments, which he believes contributes to elevated readings. He has a family history of hypertension, with both parents being hypertensive. He is currently on amlodipine for blood pressure control. He also has a cholesterol level of 123, which he is interested in managing to reduce his risk of stroke.  Medications: Outpatient Medications Prior to Visit  Medication Sig   allopurinol (ZYLOPRIM) 300 MG tablet TAKE 1 TABLET BY MOUTH EVERY DAY   amLODipine (NORVASC) 10 MG tablet TAKE 1 TABLET BY MOUTH EVERY DAY   No facility-administered medications prior to visit.    Review of Systems Last CBC Lab Results  Component Value Date   WBC 4.8 08/29/2023   HGB 15.6 08/29/2023   HCT 46.4 08/29/2023   MCV 101 (H) 08/29/2023   MCH 33.9 (H) 08/29/2023   RDW 13.3 08/29/2023   PLT 263 08/29/2023   Last metabolic panel Lab Results  Component Value Date   GLUCOSE 114 (H) 08/29/2023   NA 142 08/29/2023   K 4.9 08/29/2023   CL 103 08/29/2023   CO2 25 08/29/2023   BUN 13 08/29/2023   CREATININE 1.03 08/29/2023   EGFR 90 08/29/2023   CALCIUM 9.9  08/29/2023   PHOS 2.8 12/03/2017   PROT 7.2 08/29/2023   ALBUMIN 4.5 08/29/2023   LABGLOB 2.7 08/29/2023   AGRATIO 1.7 02/26/2023   BILITOT 1.4 (H) 08/29/2023   ALKPHOS 56 08/29/2023   AST 21 08/29/2023   ALT 22 08/29/2023   Last lipids Lab Results  Component Value Date   CHOL 191 08/29/2023   HDL 43 08/29/2023   LDLCALC 123 (H) 08/29/2023   TRIG 138 08/29/2023   CHOLHDL 4.4 08/29/2023   Last hemoglobin A1c Lab Results  Component Value Date   HGBA1C 5.8 (H) 08/29/2023   Last thyroid functions Lab Results  Component Value Date   TSH 0.550 08/29/2023     Objective    BP (!) 144/86   Pulse 80   Ht 5\' 9"  (1.753 m)   Wt 212 lb (96.2 kg)   SpO2 100%   BMI 31.31 kg/m   BP Readings from Last 3 Encounters:  10/04/23 (!) 144/86  08/29/23 (!) 140/83  02/26/23 (!) 153/99   Wt Readings from Last 3 Encounters:  10/04/23 212 lb (96.2 kg)  08/29/23 217 lb 3.2 oz (98.5 kg)  02/26/23 211 lb 14.4 oz (96.1 kg)   SpO2 Readings from Last 3 Encounters:  10/04/23 100%  08/29/23 100%  02/26/23 99%   Physical Exam Vitals and nursing note reviewed.  Constitutional:      Appearance: Normal appearance. He is obese.  HENT:     Head: Normocephalic and atraumatic.  Cardiovascular:  Rate and Rhythm: Normal rate and regular rhythm.     Pulses: Normal pulses.  Pulmonary:     Effort: Pulmonary effort is normal.  Musculoskeletal:        General: Normal range of motion.     Cervical back: Normal range of motion.  Skin:    General: Skin is warm and dry.     Capillary Refill: Capillary refill takes less than 2 seconds.  Neurological:     General: No focal deficit present.     Mental Status: He is alert and oriented to person, place, and time. Mental status is at baseline.  Psychiatric:        Attention and Perception: Attention normal.        Mood and Affect: Mood is anxious.        Speech: Speech normal.        Behavior: Behavior normal.        Thought Content: Thought  content normal.        Cognition and Memory: Cognition and memory normal.     No results found for any visits on 10/04/23.  Assessment & Plan     Problem List Items Addressed This Visit       Cardiovascular and Mediastinum   Primary hypertension - Primary   Relevant Medications   rosuvastatin (CRESTOR) 10 MG tablet   lisinopril-hydrochlorothiazide (ZESTORETIC) 20-12.5 MG tablet     Other   Elevated LDL cholesterol level   Prediabetes   Situational anxiety   The 10-year ASCVD risk score (Arnett DK, et al., 2019) is: 4.5%  The 10-year ASCVD risk score (Arnett DK, et al., 2019) is: 4.5%   Values used to calculate the score:     Age: 54 years     Sex: Male     Is Non-Hispanic African American: No     Diabetic: No     Tobacco smoker: No     Systolic Blood Pressure: 144 mmHg     Is BP treated: Yes     HDL Cholesterol: 43 mg/dL     Total Cholesterol: 191 mg/dL   Hypertension Elevated blood pressure readings in clinical settings, possibly due to anxiety. No symptoms of hypertensive urgency or emergency. Family history of hypertension. Currently on Amlodipine, max dose. -Add Lisinopril hydrochlorothiazide for kidney protection and potential future diabetes management. -Continue Amlodipine. -Check blood pressure at home. -Follow-up in one month to reassess blood pressure control.  Hyperlipidemia Discussed the role of cholesterol in vascular disease and the potential benefit of medication in reducing risk of stroke. -Consider adding cholesterol-lowering medication.  Prediabetes Stable for the past year. Discussed the importance of meal planning, portion control, and regular physical activity. -Continue lifestyle modifications. -Consider meeting with a diabetic educator or accessing resources from [facility]. -Check blood sugar at home if desired.  Follow-up -Return in one month for blood pressure check and potential lab work. -Consider cholesterol-lowering  medication at that time.    Leilani Merl, FNP, have reviewed all documentation for this visit. The documentation on 10/04/23 for the exam, diagnosis, procedures, and orders are all accurate and complete.  Jacky Kindle, FNP  [facility] [phone] (phone) [phone] (fax)  [facility]

## 2023-10-29 ENCOUNTER — Encounter: Payer: Self-pay | Admitting: Family Medicine

## 2023-10-29 ENCOUNTER — Other Ambulatory Visit: Payer: Self-pay | Admitting: Family Medicine

## 2023-10-29 ENCOUNTER — Ambulatory Visit: Payer: BC Managed Care – PPO | Admitting: Family Medicine

## 2023-10-29 VITALS — BP 138/62 | HR 98 | Ht 69.0 in | Wt 212.0 lb

## 2023-10-29 DIAGNOSIS — Z6831 Body mass index (BMI) 31.0-31.9, adult: Secondary | ICD-10-CM

## 2023-10-29 DIAGNOSIS — I10 Essential (primary) hypertension: Secondary | ICD-10-CM

## 2023-10-29 DIAGNOSIS — R7303 Prediabetes: Secondary | ICD-10-CM

## 2023-10-29 DIAGNOSIS — E782 Mixed hyperlipidemia: Secondary | ICD-10-CM

## 2023-10-29 DIAGNOSIS — Z125 Encounter for screening for malignant neoplasm of prostate: Secondary | ICD-10-CM

## 2023-10-29 NOTE — Progress Notes (Signed)
Established patient visit   Patient: Jordan Gonzales   DOB: 05-20-1976   47 y.o. Male  MRN: [id number] Visit Date: 10/29/2023  Today's healthcare provider: Jacky Kindle, FNP  Re Introduced to nurse practitioner role and practice setting.  All questions answered.  Discussed provider/patient relationship and expectations.   Subjective    HPI HPI   Follow-up b/p Last edited by Shelly Bombard, CMA on 10/29/2023  8:19 AM.      Patient seen about 1 month ago for concerns with elevated BP; works for the city of [employer]. BP needs to be lower for his job.  Hypertension, follow-up  BP Readings from Last 3 Encounters:  10/29/23 138/62  10/04/23 (!) 144/86  08/29/23 (!) 140/83   Wt Readings from Last 3 Encounters:  10/29/23 212 lb (96.2 kg)  10/04/23 212 lb (96.2 kg)  08/29/23 217 lb 3.2 oz (98.5 kg)     He was last seen for hypertension 1 months ago.  BP at that visit was 144/86. Management since that visit includes norvasc 10 mg and Zestoretic 20-12.5.  He reports excellent compliance with treatment. He is not having side effects.   He is following a Low Sodium diet. He is exercising. He does not smoke.  Use of agents associated with hypertension: none.   Outside blood pressures are 130/60s. Symptoms: No chest pain No chest pressure  No palpitations No syncope  No dyspnea No orthopnea  No paroxysmal nocturnal dyspnea No lower extremity edema   Pertinent labs Lab Results  Component Value Date   CHOL 191 08/29/2023   HDL 43 08/29/2023   LDLCALC 123 (H) 08/29/2023   TRIG 138 08/29/2023   CHOLHDL 4.4 08/29/2023   Lab Results  Component Value Date   NA 142 08/29/2023   K 4.9 08/29/2023   CREATININE 1.03 08/29/2023   EGFR 90 08/29/2023   GLUCOSE 114 (H) 08/29/2023   TSH 0.550 08/29/2023     The 10-year ASCVD risk score (Arnett DK, et al., 2019) is:  4.2%  ---------------------------------------------------------------------------------------------------  Medications: Outpatient Medications Prior to Visit  Medication Sig   allopurinol (ZYLOPRIM) 300 MG tablet TAKE 1 TABLET BY MOUTH EVERY DAY   amLODipine (NORVASC) 10 MG tablet TAKE 1 TABLET BY MOUTH EVERY DAY   lisinopril-hydrochlorothiazide (ZESTORETIC) 20-12.5 MG tablet Take 1 tablet by mouth daily.   rosuvastatin (CRESTOR) 10 MG tablet Take 1 tablet (10 mg total) by mouth daily.   No facility-administered medications prior to visit.     Objective    BP 138/62 (BP Location: Right Arm, Patient Position: Sitting, Cuff Size: Large)   Pulse 98   Ht 5\' 9"  (1.753 m)   Wt 212 lb (96.2 kg)   SpO2 99%   BMI 31.31 kg/m   Physical Exam Vitals and nursing note reviewed.  Constitutional:      Appearance: Normal appearance. He is obese.  HENT:     Head: Normocephalic and atraumatic.  Eyes:     Pupils: Pupils are equal, round, and reactive to light.  Cardiovascular:     Rate and Rhythm: Normal rate and regular rhythm.     Pulses: Normal pulses.     Heart sounds: Normal heart sounds.  Pulmonary:     Effort: Pulmonary effort is normal.     Breath sounds: Normal breath sounds.  Musculoskeletal:        General: Normal range of motion.     Cervical back: Normal range of motion.  Skin:  General: Skin is warm and dry.     Capillary Refill: Capillary refill takes less than 2 seconds.  Neurological:     General: No focal deficit present.     Mental Status: He is alert and oriented to person, place, and time. Mental status is at baseline.     No results found for any visits on 10/29/23.  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 Weight unchanged.      Return in about 4 months (around 02/27/2024) for chonic disease management.     Leilani Merl, FNP, have reviewed all documentation for this visit. The documentation on 10/29/23 for the exam, diagnosis, procedures, and orders are all accurate and complete.  Jacky Kindle, FNP  [facility] [phone] (phone) [phone] (fax)  [facility]

## 2023-10-29 NOTE — Assessment & Plan Note (Signed)
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 Weight unchanged.

## 2024-02-20 ENCOUNTER — Telehealth: Payer: Self-pay | Admitting: Family Medicine

## 2024-02-20 DIAGNOSIS — Z8739 Personal history of other diseases of the musculoskeletal system and connective tissue: Secondary | ICD-10-CM

## 2024-02-20 DIAGNOSIS — I10 Essential (primary) hypertension: Secondary | ICD-10-CM

## 2024-02-20 MED ORDER — AMLODIPINE BESYLATE 10 MG PO TABS: 10.0000 mg | ORAL_TABLET | Freq: Every day | ORAL | 0 refills | Status: DC

## 2024-02-20 MED ORDER — ALLOPURINOL 300 MG PO TABS: 300.0000 mg | ORAL_TABLET | Freq: Every day | ORAL | 0 refills | Status: DC

## 2024-02-20 NOTE — Telephone Encounter (Signed)
 [facility] amLODipine (NORVASC) 10 MG tablet  Please advise

## 2024-02-20 NOTE — Telephone Encounter (Signed)
 refilled

## 2024-02-20 NOTE — Telephone Encounter (Signed)
[facility] faxed refill request for the following medications:  allopurinol (ZYLOPRIM) 300 MG tablet   Please advise.  

## 2024-02-27 ENCOUNTER — Ambulatory Visit: Payer: Self-pay | Admitting: Family Medicine

## 2024-02-27 VITALS — BP 124/70 | HR 71 | Ht 69.0 in | Wt 208.0 lb

## 2024-02-27 DIAGNOSIS — E782 Mixed hyperlipidemia: Secondary | ICD-10-CM

## 2024-02-27 DIAGNOSIS — Z8739 Personal history of other diseases of the musculoskeletal system and connective tissue: Secondary | ICD-10-CM

## 2024-02-27 DIAGNOSIS — I10 Essential (primary) hypertension: Secondary | ICD-10-CM | POA: Diagnosis not present

## 2024-02-27 DIAGNOSIS — R7303 Prediabetes: Secondary | ICD-10-CM | POA: Diagnosis not present

## 2024-02-27 NOTE — Assessment & Plan Note (Signed)
 Gout, well-managed with allopurinol 300 mg daily. He is compliant with medication and has no issues with continuing it. - Continue allopurinol 300 mg daily - recommend uric acid level  in 4 months

## 2024-02-27 NOTE — Progress Notes (Signed)
 Established patient visit   Patient: Jordan Gonzales   DOB: 1976-08-19   48 y.o. Male  MRN: [id number] Visit Date: 02/27/2024  Today's healthcare provider: Ronnald Ramp, MD   Chief Complaint  Patient presents with   Hypertension    No concerns    Subjective     HPI     Hypertension    Additional comments: No concerns       Last edited by Thedora Hinders, CMA on 02/27/2024  8:07 AM.       Discussed the use of AI scribe software for clinical note transcription with the patient, who gave verbal consent to proceed.  History of Present Illness Jordan Gonzales "Thayer Ohm" is a 48 year old male who presents to establish care with a new PCP. He is accompanied by his wife, who is a physical therapist.  He has primary hypertension, managed with lisinopril-hydrochlorothiazide 20-12.5 mg daily and amlodipine 10 mg daily. His blood pressure is stable, and he describes it as 'great' this morning. He is actively working on lifestyle modifications, including dietary changes and regular exercise, to manage his condition.  For mixed hyperlipidemia, he takes Crestor 10 mg daily. His last lipid panel, conducted six months ago, showed an LDL of 123 mg/dL. His ASCVD risk score is 3.8%, indicating a low risk for cardiovascular events.  He has a history of gout, which is currently stable with no symptoms. He continues to take allopurinol 300 mg daily.  He is actively working on lifestyle modifications, including dietary changes and regular exercise, to manage his conditions. He has switched to healthier meal options, such as a blueberry protein shake for breakfast and a salad with black beans, ham, cucumbers, tomatoes, onions, cheese, and balsamic vinaigrette for lunch. He is also engaging in regular physical activity, including gym workouts and walking.     Past Medical History:  Diagnosis Date   Gout    Hypertension     Medications: Outpatient  Medications Prior to Visit  Medication Sig   allopurinol (ZYLOPRIM) 300 MG tablet Take 1 tablet (300 mg total) by mouth daily.   amLODipine (NORVASC) 10 MG tablet Take 1 tablet (10 mg total) by mouth daily.   lisinopril-hydrochlorothiazide (ZESTORETIC) 20-12.5 MG tablet Take 1 tablet by mouth daily.   rosuvastatin (CRESTOR) 10 MG tablet Take 1 tablet (10 mg total) by mouth daily.   No facility-administered medications prior to visit.    Review of Systems  Last metabolic panel Lab Results  Component Value Date   GLUCOSE 114 (H) 08/29/2023   NA 142 08/29/2023   K 4.9 08/29/2023   CL 103 08/29/2023   CO2 25 08/29/2023   BUN 13 08/29/2023   CREATININE 1.03 08/29/2023   EGFR 90 08/29/2023   CALCIUM 9.9 08/29/2023   PHOS 2.8 12/03/2017   PROT 7.2 08/29/2023   ALBUMIN 4.5 08/29/2023   LABGLOB 2.7 08/29/2023   AGRATIO 1.7 02/26/2023   BILITOT 1.4 (H) 08/29/2023   ALKPHOS 56 08/29/2023   AST 21 08/29/2023   ALT 22 08/29/2023   Last lipids Lab Results  Component Value Date   CHOL 191 08/29/2023   HDL 43 08/29/2023   LDLCALC 123 (H) 08/29/2023   TRIG 138 08/29/2023   CHOLHDL 4.4 08/29/2023  The 10-year ASCVD risk score (Arnett DK, et al., 2019) is: 3.8%   Last hemoglobin A1c Lab Results  Component Value Date   HGBA1C 5.8 (H) 08/29/2023   Last thyroid functions Lab Results  Component Value Date   TSH 0.550 08/29/2023        Objective    BP 124/70   Pulse 71   Ht 5\' 9"  (1.753 m)   Wt 208 lb (94.3 kg)   SpO2 100%   BMI 30.72 kg/m  BP Readings from Last 3 Encounters:  02/27/24 124/70  10/29/23 138/62  10/04/23 (!) 144/86   Wt Readings from Last 3 Encounters:  02/27/24 208 lb (94.3 kg)  10/29/23 212 lb (96.2 kg)  10/04/23 212 lb (96.2 kg)        Physical Exam Vitals reviewed.  Constitutional:      General: He is not in acute distress.    Appearance: Normal appearance. He is not ill-appearing, toxic-appearing or diaphoretic.  Eyes:      Conjunctiva/sclera: Conjunctivae normal.  Cardiovascular:     Rate and Rhythm: Normal rate and regular rhythm.     Pulses: Normal pulses.     Heart sounds: Normal heart sounds. No murmur heard.    No friction rub. No gallop.  Pulmonary:     Effort: Pulmonary effort is normal. No respiratory distress.     Breath sounds: Normal breath sounds. No stridor. No wheezing, rhonchi or rales.  Abdominal:     General: Bowel sounds are normal. There is no distension.     Palpations: Abdomen is soft.     Tenderness: There is no abdominal tenderness.  Musculoskeletal:     Right lower leg: No edema.     Left lower leg: No edema.  Skin:    Findings: No erythema or rash.  Neurological:     Mental Status: He is alert and oriented to person, place, and time.  Psychiatric:        Mood and Affect: Mood and affect normal.        Speech: Speech normal.        Behavior: Behavior normal. Behavior is cooperative.       No results found for any visits on 02/27/24.  Assessment & Plan     Problem List Items Addressed This Visit       Cardiovascular and Mediastinum   Primary hypertension - Primary   Chronic primary hypertension, well-controlled with lisinopril-hydrochlorothiazide 20-12.5 mg daily and amlodipine 10 mg daily. Blood pressure readings are satisfactory. He is interested in reducing medication burden and is actively engaging in lifestyle modifications such as diet and exercise. Discussed potential tapering of amlodipine if blood pressure remains stable with lifestyle modifications. Encouraged weight loss of 10-15 pounds to potentially reduce medication needs. - Continue lisinopril-hydrochlorothiazide 20-12.5 mg daily - Continue amlodipine 10 mg daily - Monitor blood pressure at home regularly - Consider tapering amlodipine if blood pressure remains stable with lifestyle modifications - Encourage weight loss of 10-15 pounds to potentially reduce medication needs        Other   Prediabetes    HLD (hyperlipidemia)   Chronic mixed hyperlipidemia, managed with Crestor 10 mg daily. Last lipid panel showed LDL of 123 mg/dL. ASCVD risk score is 3.8%, indicating low risk for cardiovascular events. He is concerned about medication use but understands the role of Crestor in maintaining low cardiovascular risk. Discussed that Crestor helps keep the ten-year risk of heart attack and stroke low. Pt voiced understanding. - Continue Crestor 10 mg daily - Recheck lipid panel in 4 months      History of gout   Gout, well-managed with allopurinol 300 mg daily. He is compliant with medication and has no issues with  continuing it. - Continue allopurinol 300 mg daily - recommend uric acid level  in 4 months         Assessment & Plan       Prediabetes Prediabetes with engagement in lifestyle modifications such as improved diet and exercise.   General Health Maintenance Actively engaging in lifestyle modifications, including diet and exercise, to improve overall health and potentially reduce medication burden. - Encourage continued lifestyle modifications, including diet and exercise  Follow-up Transitioning care to a new provider closer to home for convenience. - Schedule follow-up physical in 4 months with new provider - Collect lipid panel and metabolic panel at next physical     Return in about 4 months (around 06/28/2024) for CPE & labs.         Ronnald Ramp, MD  [facility] [phone] (phone) [phone] (fax)  [facility]

## 2024-02-27 NOTE — Assessment & Plan Note (Signed)
 Chronic primary hypertension, well-controlled with lisinopril-hydrochlorothiazide 20-12.5 mg daily and amlodipine 10 mg daily. Blood pressure readings are satisfactory. He is interested in reducing medication burden and is actively engaging in lifestyle modifications such as diet and exercise. Discussed potential tapering of amlodipine if blood pressure remains stable with lifestyle modifications. Encouraged weight loss of 10-15 pounds to potentially reduce medication needs. - Continue lisinopril-hydrochlorothiazide 20-12.5 mg daily - Continue amlodipine 10 mg daily - Monitor blood pressure at home regularly - Consider tapering amlodipine if blood pressure remains stable with lifestyle modifications - Encourage weight loss of 10-15 pounds to potentially reduce medication needs

## 2024-02-27 NOTE — Assessment & Plan Note (Signed)
 Chronic mixed hyperlipidemia, managed with Crestor 10 mg daily. Last lipid panel showed LDL of 123 mg/dL. ASCVD risk score is 3.8%, indicating low risk for cardiovascular events. He is concerned about medication use but understands the role of Crestor in maintaining low cardiovascular risk. Discussed that Crestor helps keep the ten-year risk of heart attack and stroke low. Pt voiced understanding. - Continue Crestor 10 mg daily - Recheck lipid panel in 4 months

## 2024-05-18 ENCOUNTER — Other Ambulatory Visit: Payer: Self-pay | Admitting: Family Medicine

## 2024-05-18 DIAGNOSIS — Z8739 Personal history of other diseases of the musculoskeletal system and connective tissue: Secondary | ICD-10-CM

## 2024-05-18 DIAGNOSIS — I10 Essential (primary) hypertension: Secondary | ICD-10-CM

## 2024-05-19 NOTE — Telephone Encounter (Signed)
 LOV 05/07/2024 NOV none LRF 02/20/2024 LABS 08/28/2024

## 2024-08-15 ENCOUNTER — Telehealth: Payer: Self-pay | Admitting: Family Medicine

## 2024-08-15 DIAGNOSIS — I10 Essential (primary) hypertension: Secondary | ICD-10-CM

## 2024-08-15 NOTE — Telephone Encounter (Signed)
[facility] for the following medications:  lisinopril-hydrochlorothiazide (ZESTORETIC) 20-12.5 MG tablet   Please advise.  

## 2024-08-15 NOTE — Telephone Encounter (Signed)
 Patient should have one refill left at pharmacy.

## 2024-08-25 ENCOUNTER — Other Ambulatory Visit: Payer: Self-pay | Admitting: Family Medicine

## 2024-08-25 NOTE — Telephone Encounter (Unsigned)
 Copied from CRM [id number]. Topic: Clinical - Medication Refill >> Aug 25, 2024  1:24 PM Sophia H wrote: Medication: lisinopril -hydrochlorothiazide   Has the patient contacted their pharmacy? Yes, [facility].   This is the patient's preferred [facility]:  [id number] [address] [place] [place] - [address] [place] [address] Phone: [phone] Fax: [phone]  Is this the correct pharmacy for this prescription? Yes If no, delete pharmacy and type the correct one.   Has the prescription been filled recently? Yes  Is the patient out of the medication? Yes  Has the patient been seen for an appointment in the last year OR does the patient have an upcoming appointment? Yes, seen in April / TOC scheduled for 10/13.  Can we respond through MyChart? Yes  Agent: Please be advised that Rx refills may take up to 3 business days. We ask that you follow-up with your pharmacy.

## 2024-08-27 ENCOUNTER — Other Ambulatory Visit: Payer: Self-pay | Admitting: Family Medicine

## 2024-08-27 NOTE — Telephone Encounter (Signed)
 Too soon for refill, LRF 10/04/23 for 90 and 3 RF. Next RF due in Nov.  Requested Prescriptions  Pending Prescriptions Disp Refills   lisinopril -hydrochlorothiazide  (ZESTORETIC ) 20-12.5 MG tablet 90 tablet 3    Sig: Take 1 tablet by mouth daily.     Cardiovascular:  ACEI + Diuretic Combos Failed - 08/27/2024  8:27 AM      Failed - Na in normal range and within 180 days    Sodium  Date Value Ref Range Status  08/29/2023 142 134 - 144 mmol/L Final         Failed - K in normal range and within 180 days    Potassium  Date Value Ref Range Status  08/29/2023 4.9 3.5 - 5.2 mmol/L Final         Failed - Cr in normal range and within 180 days    Creatinine, Ser  Date Value Ref Range Status  08/29/2023 1.03 0.76 - 1.27 mg/dL Final         Failed - eGFR is 30 or above and within 180 days    GFR calc Af Amer  Date Value Ref Range Status  10/07/2020 105 >59 mL/min/1.73 Final    Comment:    **In accordance with recommendations from the NKF-ASN Task force,**   [facility] is in the process of updating its eGFR calculation to the   2021 CKD-EPI creatinine equation that estimates kidney function   without a race variable.    GFR calc non Af Amer  Date Value Ref Range Status  10/07/2020 90 >59 mL/min/1.73 Final   eGFR  Date Value Ref Range Status  08/29/2023 90 >59 mL/min/1.73 Final         Failed - Valid encounter within last 6 months    Recent Outpatient Visits           6 months ago Primary hypertension   [place], [place], [place]              Passed - Patient is not pregnant      Passed - Last BP in normal range    BP Readings from Last 1 Encounters:  02/27/24 124/70

## 2024-09-01 ENCOUNTER — Ambulatory Visit: Admitting: Family Medicine

## 2024-09-01 VITALS — BP 171/85 | HR 103 | Temp 98.1°F | Resp 18 | Ht 69.0 in | Wt 211.0 lb

## 2024-09-01 DIAGNOSIS — R7303 Prediabetes: Secondary | ICD-10-CM

## 2024-09-01 DIAGNOSIS — E782 Mixed hyperlipidemia: Secondary | ICD-10-CM

## 2024-09-01 DIAGNOSIS — F40298 Other specified phobia: Secondary | ICD-10-CM

## 2024-09-01 DIAGNOSIS — I10 Essential (primary) hypertension: Secondary | ICD-10-CM | POA: Diagnosis not present

## 2024-09-02 DIAGNOSIS — F40298 Other specified phobia: Secondary | ICD-10-CM | POA: Insufficient documentation

## 2024-09-02 NOTE — Assessment & Plan Note (Signed)
 Does not wish to get his blood work done today because he has needle phobia.  Reports he recently had labs done for his physical on the job and he will have those labs sent over  Via MyChart

## 2024-09-02 NOTE — Assessment & Plan Note (Signed)
 Reports blood pressure is much better controlled at home runs in the 120s over 70s.  He is on amlodipine  10 mg and lisinopril  HCTZ 20-12.5.  Ask him to record his blood pressures and record them.  Follow-up in 2 weeks with your blood pressure readings.

## 2024-09-02 NOTE — Assessment & Plan Note (Signed)
 He is on Crestor  10 mg daily.  His 10-year ASCVD risk score varies between 3.8% and 6.7% depending on his blood pressure.

## 2024-09-02 NOTE — Progress Notes (Signed)
 Established Patient Office Visit  Subjective   Patient ID: Jordan Gonzales, male    DOB: Apr 05, 1976  Age: 48 y.o. 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  He has HTN, gout and mixed hyperlipidemia.  His mother had colon cancer.  Last colonoscopy 10/14/2022.   He takes allopurinol  300 mg daily and is no longer had any problems with gout.  He used to get it in his right great toe. He goes to the gym walks on the treadmill 3.5 miles at a sharp and incline, then works out on [employer] and then [employer].  He does this 3-4 times a week. Has a pretty healthy diet eats a lot of fruit and yogurt and then grilled chicken and vegetables at lunch.  He preps food on Sundays. He has never been a smoker, drinks 2 beers on the weekends, does not use drugs. Family history mother had colon cancer father has had prostate cancer.  Reports that he has recently had his labs done including his PSA and it was normal. Blood pressure 171/91 and 164/94.  Reports he checks his blood pressure at home and it runs in the 120s over 70s and 80s.  His wife who is a physical therapist checks his blood pressure for him.  Current blood pressure regimen is lisinopril /HCTZ 20-12.5 and amlodipine  10 mg daily. Takes Crestor  10 mg for his mixed hyperlipidemia 10-year ASCVD risk score is 3.8%.  09/09/2023 total cholesterol 191 HDL 43 LDL 123 and Triggs 861.  At the same time his A1c was 5.8%             Objective:     BP (!) 171/85 (BP Location: Left Arm, Patient Position: Sitting, Cuff Size: Normal)   Pulse (!) 103   Temp 98.1 F (36.7 C) (Oral)   Resp 18   Ht 5' 9 (1.753 m)   Wt 211 lb (95.7 kg)   SpO2 98%   BMI 31.16 kg/m    Physical Exam Vitals and nursing note reviewed.  Constitutional:      Appearance: Normal appearance.  HENT:     Head: Normocephalic and  atraumatic.  Eyes:     Conjunctiva/sclera: Conjunctivae normal.  Cardiovascular:     Rate and Rhythm: Normal rate and regular rhythm.  Pulmonary:     Effort: Pulmonary effort is normal.     Breath sounds: Normal breath sounds.  Musculoskeletal:     Right lower leg: No edema.     Left lower leg: No edema.  Skin:    General: Skin is warm and dry.  Neurological:     Mental Status: He is alert and oriented to person, place, and time.  Psychiatric:        Mood and Affect: Mood normal.        Behavior: Behavior normal.        Thought Content: Thought content normal.        Judgment: Judgment normal.          No results found for any visits on 09/01/24.    The 10-year ASCVD risk score (Arnett DK, et al., 2019) is: 6.7%    Assessment & Plan:  Needle phobia Assessment & Plan: Does not wish to get his blood work done today because he has needle phobia.  Reports he recently had labs done for his physical on the job and he will  have those labs sent over  Via MyChart   Mixed hyperlipidemia Assessment & Plan: He is on Crestor  10 mg daily.  His 10-year ASCVD risk score varies between 3.8% and 6.7% depending on his blood pressure.   Prediabetes Assessment & Plan: A1c is 5.8% in 2024.  Goes to the gym 3 to 4 days a week and works out.  Has a healthy diet.   Primary hypertension Assessment & Plan: Reports blood pressure is much better controlled at home runs in the 120s over 70s.  He is on amlodipine  10 mg and lisinopril  HCTZ 20-12.5.  Ask him to record his blood pressures and record them.  Follow-up in 2 weeks with your blood pressure readings.      Return in about 6 months (around 03/02/2025).    Briawna Carver K Thersia Petraglia, MD

## 2024-09-02 NOTE — Assessment & Plan Note (Signed)
 A1c is 5.8% in 2024.  Goes to the gym 3 to 4 days a week and works out.  Has a healthy diet.

## 2024-09-08 ENCOUNTER — Encounter: Payer: Self-pay | Admitting: Family Medicine

## 2024-09-09 ENCOUNTER — Ambulatory Visit: Payer: Self-pay

## 2024-09-09 NOTE — Telephone Encounter (Signed)
 FYI Only or Action Required?: FYI only for provider.  Patient was last seen in primary care on 09/01/2024 by Ziglar, Susan K, MD.  Called Nurse Triage reporting Palpitations.  Symptoms began several days ago.  Interventions attempted: Rest, hydration, or home remedies.  Symptoms are: unchanged.  Triage Disposition: See PCP When Office is Open (Within 3 Days)  Patient/caregiver understands and will follow disposition?: Yes  Copied from CRM #[id number]. Topic: Clinical - Red Word Triage >> Sep 09, 2024  2:59 PM Tobias L wrote: Red Word that prompted transfer to Nurse Triage: having heart palpitations Reason for Disposition  [1] Palpitations AND [2] no improvement after using Care Advice  Answer Assessment - Initial Assessment Questions Pt reports getting message from provider today on MyChart asking pt to call and schedule appt to come in for lab work and to obtain a heart monitor for palpitations he has been having since last Thursday. Appt scheduled for tomorrow. Advised ED or UC in the meantime for worsening symptoms.  1. DESCRIPTION: Please describe your heart rate or heartbeat that you are having (e.g., fast/slow, regular/irregular, skipped or extra beats, palpitations)     Pt reports he can feel his heart in his chest. Feels like his heart is skipping a beat.  2. ONSET: When did it start? (e.g., minutes, hours, days)      Thursday of last week  3. DURATION: How long does it last (e.g., seconds, minutes, hours)     Sporadic, comes and goes  4. PATTERN Does it come and go, or has it been constant since it started?  Does it get worse with exertion?   Are you feeling it now?     Sporadic, comes and goes  5. TAP: Using your hand, can you tap out what you are feeling on a chair or table in front of you, so that I can hear? Note: Not all patients can do this.       Unsure  6. HEART RATE: Can you tell me your heart rate? How many beats in 15 seconds?  Note: Not all  patients can do this.       80's sitting. 103 at the doctors office. Pt think this is d/t stress of going to the doctor.  7. RECURRENT SYMPTOM: Have you ever had this before? If Yes, ask: When was the last time? and What happened that time?      No  8. CAUSE: What do you think is causing the palpitations?     Unsure  9. CARDIAC HISTORY: Do you have any history of heart disease? (e.g., heart attack, angina, bypass surgery, angioplasty, arrhythmia)      No  10. OTHER SYMPTOMS: Do you have any other symptoms? (e.g., dizziness, chest pain, sweating, difficulty breathing)       Makes voice go lower and feeling like pt has to cough when experience palpitations. No CP or SOB.  Protocols used: Heart Rate and Heartbeat Questions-A-AH

## 2024-09-10 ENCOUNTER — Ambulatory Visit: Attending: Family Medicine

## 2024-09-10 ENCOUNTER — Encounter: Payer: Self-pay | Admitting: Family Medicine

## 2024-09-10 ENCOUNTER — Ambulatory Visit: Admitting: Family Medicine

## 2024-09-10 VITALS — BP 147/83 | HR 75 | Temp 98.0°F | Resp 18 | Ht 69.0 in | Wt 214.0 lb

## 2024-09-10 DIAGNOSIS — R002 Palpitations: Secondary | ICD-10-CM

## 2024-09-10 NOTE — Progress Notes (Unsigned)
 Established Patient Office Visit  Subjective   Patient ID: Jordan Gonzales, male    DOB: February 14, 1976  Age: 48 y.o. MRN: [id number]  Chief Complaint  Patient presents with   Palpitations    Palpitations    Discussed the use of AI scribe software for clinical note transcription with the patient, who gave verbal consent to proceed.  History of Present Illness   Jordan Gonzales is a 48 year old male with hypertension and gout who presents with episodes of heart palpitations.  He has been experiencing episodes of heart palpitations since last Thursday, initially occurring while sitting beside a soccer field. He describes the sensation as his heart 'stumbling' and getting 'out of sync,' which made it difficult to talk, though he did not experience any discomfort or pain. It felt like coughing would return his heart rhythm to normal. That night, while lying in bed, he felt his heart make a 'big thump' intermittently, occurring every three to four minutes before falling asleep. Over the weekend, he continued to experience these symptoms, including a 'flutter' sensation and skipped beats, which he could feel when checking his pulse. No dizziness or sweating occurred during these episodes. Today, he reports feeling no symptoms but was concerned enough to schedule an appointment.  He has a history of hypertension, managed with lisinopril  HCTZ 20/12.5 mg and amlodipine  10 mg. His blood pressure readings at home are typically in the 120s/70s-80s, but he noted a reading of 141/87 last night. He attributes his recent lifestyle changes, including dietary modifications and regular exercise, to his blood pressure management. He walks on a treadmill at 3.5 miles per hour for 20 minutes, uses a Stairmaster, and lifts weights three to four times a week. He follows a healthy diet, avoiding bread, pasta, chips, and corn, and includes grilled chicken and steamed vegetables in his  meals.  He has a history of gout, which is well-controlled with allopurinol  300 mg daily. He reports no recent gout attacks as long as he continues his medication.  His past medical history includes a meniscus repair in his right knee and a recent colonoscopy in 2023 or possibly January 2024, with a recommendation to repeat in ten years. His mother had colon cancer.         Objective:     BP (!) 147/83 (BP Location: Left Arm, Patient Position: Sitting, Cuff Size: Normal)   Pulse 75   Temp 98 F (36.7 C) (Oral)   Resp 18   Ht 5' 9 (1.753 m)   Wt 214 lb (97.1 kg)   SpO2 97%   BMI 31.60 kg/m  {Vitals History (Optional):23777}  Physical Exam Vitals and nursing note reviewed.  Constitutional:      Appearance: Normal appearance.  HENT:     Head: Normocephalic and atraumatic.  Eyes:     Conjunctiva/sclera: Conjunctivae normal.  Cardiovascular:     Rate and Rhythm: Normal rate and regular rhythm.  Pulmonary:     Effort: Pulmonary effort is normal.     Breath sounds: Normal breath sounds.  Musculoskeletal:     Right lower leg: No edema.     Left lower leg: No edema.  Skin:    General: Skin is warm and dry.  Neurological:     Mental Status: He is alert and oriented to person, place, and time.  Psychiatric:        Mood and Affect: Mood normal.        Behavior: Behavior normal.  Thought Content: Thought content normal.        Judgment: Judgment normal.      {Perform Simple Foot Exam  Perform Detailed exam:1} {Insert foot Exam (Optional):30965}   No results found for any visits on 09/10/24.  {Labs (Optional):23779}  The 10-year ASCVD risk score (Arnett DK, et al., 2019) is: 5.1%    Assessment & Plan:  Palpitations -     EKG 12-Lead -     CMP14+EGFR -     Magnesium -     TSH + free T4 -     CBC with Differential/Platelet     Return in about 4 weeks (around 10/08/2024).    Michaelyn Wall K Martice Doty, MD

## 2024-09-11 ENCOUNTER — Ambulatory Visit: Payer: Self-pay | Admitting: Family Medicine

## 2024-09-11 DIAGNOSIS — R002 Palpitations: Secondary | ICD-10-CM | POA: Insufficient documentation

## 2024-09-11 LAB — CMP14+EGFR
ALT: 22 IU/L (ref 0–44)
AST: 20 IU/L (ref 0–40)
Albumin: 4.9 g/dL (ref 4.1–5.1)
Alkaline Phosphatase: 52 IU/L (ref 47–123)
BUN/Creatinine Ratio: 26 — ABNORMAL HIGH (ref 9–20)
BUN: 26 mg/dL — ABNORMAL HIGH (ref 6–24)
Bilirubin Total: 1.2 mg/dL (ref 0.0–1.2)
CO2: 22 mmol/L (ref 20–29)
Calcium: 9.5 mg/dL (ref 8.7–10.2)
Chloride: 98 mmol/L (ref 96–106)
Creatinine, Ser: 1 mg/dL (ref 0.76–1.27)
Globulin, Total: 2.3 g/dL (ref 1.5–4.5)
Glucose: 112 mg/dL — ABNORMAL HIGH (ref 70–99)
Potassium: 4.1 mmol/L (ref 3.5–5.2)
Sodium: 136 mmol/L (ref 134–144)
Total Protein: 7.2 g/dL (ref 6.0–8.5)
eGFR: 92 mL/min/1.73 (ref >59)

## 2024-09-11 LAB — TSH+FREE T4
Free T4: 1.52 ng/dL (ref 0.82–1.77)
TSH: 0.583 u[IU]/mL (ref 0.450–4.500)

## 2024-09-11 LAB — CBC WITH DIFFERENTIAL/PLATELET
Basophils Absolute: 0 x10E3/uL (ref 0.0–0.2)
Basos: 1 %
EOS (ABSOLUTE): 0.1 x10E3/uL (ref 0.0–0.4)
Eos: 2 %
Hematocrit: 44.3 % (ref 37.5–51.0)
Hemoglobin: 14.9 g/dL (ref 13.0–17.7)
Immature Grans (Abs): 0 x10E3/uL (ref 0.0–0.1)
Immature Granulocytes: 0 %
Lymphocytes Absolute: 1.6 x10E3/uL (ref 0.7–3.1)
Lymphs: 26 %
MCH: 33.1 pg — ABNORMAL HIGH (ref 26.6–33.0)
MCHC: 33.6 g/dL (ref 31.5–35.7)
MCV: 98 fL — ABNORMAL HIGH (ref 79–97)
Monocytes Absolute: 0.8 x10E3/uL (ref 0.1–0.9)
Monocytes: 13 %
Neutrophils Absolute: 3.5 x10E3/uL (ref 1.4–7.0)
Neutrophils: 58 %
Platelets: 244 x10E3/uL (ref 150–450)
RBC: 4.5 x10E6/uL (ref 4.14–5.80)
RDW: 12.9 % (ref 11.6–15.4)
WBC: 6 x10E3/uL (ref 3.4–10.8)

## 2024-09-11 LAB — MAGNESIUM: Magnesium: 2.2 mg/dL (ref 1.6–2.3)

## 2024-09-11 NOTE — Assessment & Plan Note (Signed)
 EKG in office today was normal with a rate 70.  No ST or T wave changes suggestive of ischemia.  Will check causes of palpitations: magnesium level, thyroid function, potassium and hemoglobin.  If these are normal will order a Zio patch for 2 weeks.

## 2024-09-25 ENCOUNTER — Encounter: Payer: Self-pay | Admitting: Family Medicine

## 2024-09-25 DIAGNOSIS — Z8739 Personal history of other diseases of the musculoskeletal system and connective tissue: Secondary | ICD-10-CM

## 2024-09-25 DIAGNOSIS — I10 Essential (primary) hypertension: Secondary | ICD-10-CM

## 2024-09-26 ENCOUNTER — Other Ambulatory Visit: Payer: Self-pay | Admitting: Family Medicine

## 2024-09-26 DIAGNOSIS — Z8739 Personal history of other diseases of the musculoskeletal system and connective tissue: Secondary | ICD-10-CM

## 2024-09-26 DIAGNOSIS — I10 Essential (primary) hypertension: Secondary | ICD-10-CM

## 2024-09-26 MED ORDER — ALLOPURINOL 300 MG PO TABS
300.0000 mg | ORAL_TABLET | Freq: Every day | ORAL | 1 refills | Status: DC
Start: 2024-09-26 — End: 2024-09-26

## 2024-09-26 MED ORDER — AMLODIPINE BESYLATE 10 MG PO TABS: 10.0000 mg | ORAL_TABLET | Freq: Every day | ORAL | 1 refills | Status: DC

## 2024-09-26 MED ORDER — LISINOPRIL-HYDROCHLOROTHIAZIDE 20-12.5 MG PO TABS: 1.0000 | ORAL_TABLET | Freq: Every day | ORAL | 3 refills | Status: DC

## 2024-09-26 MED ORDER — LISINOPRIL-HYDROCHLOROTHIAZIDE 20-12.5 MG PO TABS: 1.0000 | ORAL_TABLET | Freq: Every day | ORAL | 3 refills | Status: AC

## 2024-09-26 MED ORDER — ROSUVASTATIN CALCIUM 10 MG PO TABS: 10.0000 mg | ORAL_TABLET | Freq: Every day | ORAL | 3 refills | Status: AC

## 2024-09-26 MED ORDER — ROSUVASTATIN CALCIUM 10 MG PO TABS: 10.0000 mg | ORAL_TABLET | Freq: Every day | ORAL | 3 refills | Status: DC

## 2024-09-26 MED ORDER — ALLOPURINOL 300 MG PO TABS: 300.0000 mg | ORAL_TABLET | Freq: Every day | ORAL | 1 refills | Status: AC

## 2024-09-26 MED ORDER — AMLODIPINE BESYLATE 10 MG PO TABS: 10.0000 mg | ORAL_TABLET | Freq: Every day | ORAL | 1 refills | Status: AC

## 2024-09-26 NOTE — Telephone Encounter (Signed)
 Copied from CRM #[id number]. Topic: Clinical - Medication Refill >> Sep 26, 2024  8:01 AM Emylou G wrote: Medication: lisinopril -hydrochlorothiazide  (ZESTORETIC ) 20-12.5 MG tablet amLODipine  (NORVASC ) 10 MG tablet rosuvastatin  (CRESTOR ) 10 MG tablet allopurinol  (ZYLOPRIM ) 300 MG tablet   Has the patient contacted their pharmacy? Yes (Agent: If no, request that the patient contact the pharmacy for the refill. If patient does not wish to contact the pharmacy document the reason why and proceed with request.) (Agent: If yes, when and what did the pharmacy advise?) said to call us   This is the patient's preferred pharmacy:  [facility] [phone] [address], [place] - [address] [place] [address] Phone: [phone] Fax: [phone]  Is this the correct pharmacy for this prescription? Yes If no, delete pharmacy and type the correct one.   Has the prescription been filled recently? No  Is the patient out of the medication? Yes  Has the patient been seen for an appointment in the last year OR does the patient have an upcoming appointment? Yes  Can we respond through MyChart? Yes  Agent: Please be advised that Rx refills may take up to 3 business days. We ask that you follow-up with your pharmacy.

## 2024-10-01 ENCOUNTER — Telehealth: Payer: Self-pay | Admitting: Family Medicine

## 2024-10-01 DIAGNOSIS — R002 Palpitations: Secondary | ICD-10-CM | POA: Diagnosis not present

## 2024-10-01 NOTE — Telephone Encounter (Signed)
 Called patient and advised his heart monitor was normal.  He reports he has not had any symptoms and did not have symptoms when he wore the heart monitor.  Advised if he feels palpitations again we can always get the monitor again.

## 2024-10-10 ENCOUNTER — Encounter: Payer: Self-pay | Admitting: Family Medicine

## 2024-10-10 ENCOUNTER — Ambulatory Visit (INDEPENDENT_AMBULATORY_CARE_PROVIDER_SITE_OTHER): Admitting: Family Medicine

## 2024-10-10 VITALS — BP 149/87 | HR 87 | Temp 98.0°F | Resp 18 | Ht 69.0 in | Wt 217.0 lb

## 2024-10-10 DIAGNOSIS — R002 Palpitations: Secondary | ICD-10-CM

## 2024-10-13 ENCOUNTER — Telehealth: Payer: Self-pay | Admitting: Family Medicine

## 2024-10-13 NOTE — Telephone Encounter (Signed)
 Called pt and requested a copy of his insurance card front and back to get approval for complete echo/cardiology referral Dr.Ziglar put in. Asked pt to call back so I can give him my email if he would like to email it over instead of driving all the way up here. If pt calls back and would like to email this to me my email is [email or web address]

## 2024-10-14 NOTE — Progress Notes (Signed)
 Established Patient Office Visit  Subjective   Patient ID: Ramondo Dietze, male    DOB: Jun 06, 1976  Age: 48 y.o. MRN: [id number]  No chief complaint on file.   HPI Corliss Lamartina is a delightful 48 year old gentleman with hypertension, gout and palpitations. Discussed the use of AI scribe software for clinical note transcription with the patient, who gave verbal consent to proceed.  History of Present Illness   Bryley Kovacevic Kharon Hixon is a 48 year old male who presents with palpitations.  He experienced palpitations that felt like a need to cough to stop or start something, followed by a hard thump. These symptoms occurred while he sitting at a soccer field the weekend before he contacted the clinic. Since then, he has not experienced any further palpitations, including during the two weeks while he wore a cardiac monitor. The monitor results were normal, and all blood work was negative for causes of palpitations.  He acknowledges not drinking enough water, which was evident in his blood work showing a BUN/creatinine ratio of 26. He is trying to increase his water intake but finds it challenging due to frequent urination, which disrupts his daily activities and sleep.   He maintains a healthy diet during lunch, consisting of boiled chicken tenderloins with Italian dressing and steamed vegetables. However, his dinner varies with family meals. He has been incorporating more salmon into his diet and is making efforts to eat in moderation.  He exercises regularly, attending the gym three to four times a week. His routine includes walking on a treadmill at 3.5 mph for 20 minutes, using a stair stepper for 12 minutes, and lifting weights to near failure. He reports sweating significantly during workouts, indicating a high level of exertion.  No anxiety related to his palpitations and no excessive worry about his health, although his wife tends to worry more. He  experiences frequent urination, especially at night, due to increased water intake.     Objective:     BP (!) 149/87 (BP Location: Left Arm, Patient Position: Sitting, Cuff Size: Normal)   Pulse 87   Temp 98 F (36.7 C) (Oral)   Resp 18   Ht 5' 9 (1.753 m)   Wt 217 lb (98.4 kg)   SpO2 97%   BMI 32.05 kg/m    Physical Exam Vitals and nursing note reviewed.  Constitutional:      Appearance: Normal appearance.  HENT:     Head: Normocephalic and atraumatic.  Eyes:     Conjunctiva/sclera: Conjunctivae normal.  Cardiovascular:     Rate and Rhythm: Normal rate and regular rhythm.  Pulmonary:     Effort: Pulmonary effort is normal.     Breath sounds: Normal breath sounds.  Musculoskeletal:     Right lower leg: No edema.     Left lower leg: No edema.  Skin:    General: Skin is warm and dry.  Neurological:     Mental Status: He is alert and oriented to person, place, and time.  Psychiatric:        Mood and Affect: Mood normal.        Behavior: Behavior normal.        Thought Content: Thought content normal.        Judgment: Judgment normal.          No results found for any visits on 10/10/24.    The 10-year ASCVD risk score (Arnett DK, et al., 2019) is: 5.3%    Assessment & Plan:  Palpitations Assessment & Plan: Experienced palpitations during a specific weekend, with no recurrence since. Wore a cardiac monitor for two weeks, which returned normal results. Blood work also showed no abnormalities. Dehydration noted as a potential contributing factor. No current symptoms or anxiety related to palpitations. Cardiac echo recommended to assess heart function and rule out structural abnormalities.  Orders: -     ECHOCARDIOGRAM COMPLETE; Future                Return in about 4 weeks (around 11/07/2024).    Sheana Bir K Nyriah Coote, MD

## 2024-10-14 NOTE — Assessment & Plan Note (Signed)
 Experienced palpitations during a specific weekend, with no recurrence since. Wore a cardiac monitor for two weeks, which returned normal results. Blood work also showed no abnormalities. Dehydration noted as a potential contributing factor. No current symptoms or anxiety related to palpitations. Cardiac echo recommended to assess heart function and rule out structural abnormalities.

## 2024-11-28 ENCOUNTER — Ambulatory Visit

## 2024-12-12 ENCOUNTER — Ambulatory Visit: Attending: Cardiology

## 2024-12-12 ENCOUNTER — Encounter: Payer: Self-pay | Admitting: Family Medicine

## 2024-12-12 DIAGNOSIS — R002 Palpitations: Secondary | ICD-10-CM

## 2024-12-12 LAB — ECHOCARDIOGRAM COMPLETE
AR max vel: 2.73 cm2
AV Area VTI: 2.86 cm2
AV Area mean vel: 2.64 cm2
AV Mean grad: 6 mmHg
AV Peak grad: 10.2 mmHg
Ao pk vel: 1.6 m/s
Area-P 1/2: 3.02 cm2
S' Lateral: 2.33 cm

## 2024-12-13 ENCOUNTER — Ambulatory Visit: Payer: Self-pay | Admitting: Family Medicine

## 2025-03-02 ENCOUNTER — Ambulatory Visit: Admitting: Family Medicine
